# Patient Record
Sex: Female | Born: 1940 | Race: Black or African American | Hispanic: No | Marital: Married
Health system: Southern US, Community
[De-identification: ages and names within clinical notes are randomized; demographics above are authoritative.]

---

## 2005-09-24 ENCOUNTER — Emergency Department: Payer: Self-pay | Admitting: Unknown Physician Specialty

## 2010-12-10 ENCOUNTER — Emergency Department: Payer: Self-pay | Admitting: Emergency Medicine

## 2012-08-25 ENCOUNTER — Ambulatory Visit: Payer: Self-pay | Admitting: Oncology

## 2012-09-23 LAB — CBC
HCT: 44.3 % (ref 35.0–47.0)
HGB: 14.8 g/dL (ref 12.0–16.0)
MCHC: 33.4 g/dL (ref 32.0–36.0)
Platelet: 204 10*3/uL (ref 150–440)
RBC: 5.03 10*6/uL (ref 3.80–5.20)
WBC: 7.9 10*3/uL (ref 3.6–11.0)

## 2012-09-23 LAB — COMPREHENSIVE METABOLIC PANEL
Alkaline Phosphatase: 628 U/L — ABNORMAL HIGH (ref 50–136)
Anion Gap: 9 (ref 7–16)
Bilirubin,Total: 13.7 mg/dL — ABNORMAL HIGH (ref 0.2–1.0)
Chloride: 99 mmol/L (ref 98–107)
Co2: 27 mmol/L (ref 21–32)
Creatinine: 0.94 mg/dL (ref 0.60–1.30)
Osmolality: 275 (ref 275–301)
SGPT (ALT): 665 U/L — ABNORMAL HIGH (ref 12–78)
Sodium: 135 mmol/L — ABNORMAL LOW (ref 136–145)
Total Protein: 7.9 g/dL (ref 6.4–8.2)

## 2012-09-23 LAB — URINALYSIS, COMPLETE
Blood: NEGATIVE
Cellular Cast: 27
Glucose,UR: 150 mg/dL (ref 0–75)
Hyaline Cast: 25
Nitrite: NEGATIVE
Protein: 30
Specific Gravity: 1.015 (ref 1.003–1.030)
Squamous Epithelial: 3

## 2012-09-23 LAB — LIPASE, BLOOD: Lipase: 347 U/L (ref 73–393)

## 2012-09-24 ENCOUNTER — Inpatient Hospital Stay: Payer: Self-pay | Admitting: Internal Medicine

## 2012-09-25 ENCOUNTER — Ambulatory Visit: Payer: Self-pay | Admitting: Oncology

## 2012-09-25 LAB — HEPATIC FUNCTION PANEL A (ARMC)
Albumin: 3.2 g/dL — ABNORMAL LOW (ref 3.4–5.0)
Bilirubin, Direct: 10.8 mg/dL — ABNORMAL HIGH (ref 0.00–0.20)
SGOT(AST): 429 U/L — ABNORMAL HIGH (ref 15–37)
SGPT (ALT): 618 U/L — ABNORMAL HIGH (ref 12–78)
Total Protein: 6.8 g/dL (ref 6.4–8.2)

## 2012-09-25 LAB — PROTIME-INR: INR: 0.9

## 2012-09-26 ENCOUNTER — Ambulatory Visit: Payer: Self-pay | Admitting: Oncology

## 2012-09-26 LAB — HEPATIC FUNCTION PANEL A (ARMC)
Albumin: 2.9 g/dL — ABNORMAL LOW (ref 3.4–5.0)
Alkaline Phosphatase: 517 U/L — ABNORMAL HIGH (ref 50–136)
Bilirubin, Direct: 4.9 mg/dL — ABNORMAL HIGH (ref 0.00–0.20)
Bilirubin,Total: 6.2 mg/dL — ABNORMAL HIGH (ref 0.2–1.0)
Total Protein: 6.6 g/dL (ref 6.4–8.2)

## 2012-09-26 LAB — LIPASE, BLOOD: Lipase: 148 U/L (ref 73–393)

## 2012-10-10 LAB — COMPREHENSIVE METABOLIC PANEL
Albumin: 3.6 g/dL (ref 3.4–5.0)
Alkaline Phosphatase: 1486 U/L — ABNORMAL HIGH (ref 50–136)
Anion Gap: 7 (ref 7–16)
BUN: 6 mg/dL — ABNORMAL LOW (ref 7–18)
Co2: 29 mmol/L (ref 21–32)
EGFR (African American): >60
Osmolality: 269 (ref 275–301)
Potassium: 3.7 mmol/L (ref 3.5–5.1)
SGOT(AST): 168 U/L — ABNORMAL HIGH (ref 15–37)
Total Protein: 9 g/dL — ABNORMAL HIGH (ref 6.4–8.2)

## 2012-10-10 LAB — CBC CANCER CENTER
HCT: 42.6 % (ref 35.0–47.0)
HGB: 14.8 g/dL (ref 12.0–16.0)
Lymphocyte #: 2.9 x10 3/mm (ref 1.0–3.6)
Lymphocyte %: 29.6 %
MCH: 30.5 pg (ref 26.0–34.0)
MCV: 88 fL (ref 80–100)
Monocyte #: 0.6 x10 3/mm (ref 0.2–0.9)
Monocyte %: 6.3 %
Neutrophil #: 5.9 x10 3/mm (ref 1.4–6.5)
Neutrophil %: 60.1 %
RDW: 14.8 % — ABNORMAL HIGH (ref 11.5–14.5)

## 2012-10-11 LAB — CEA: CEA: 15.7 ng/mL — ABNORMAL HIGH (ref 0.0–4.7)

## 2012-10-22 LAB — COMPREHENSIVE METABOLIC PANEL
Albumin: 2.6 g/dL — ABNORMAL LOW (ref 3.4–5.0)
Alkaline Phosphatase: 775 U/L — ABNORMAL HIGH (ref 50–136)
Anion Gap: 9 (ref 7–16)
Bilirubin,Total: 1.8 mg/dL — ABNORMAL HIGH (ref 0.2–1.0)
Chloride: 95 mmol/L — ABNORMAL LOW (ref 98–107)
Co2: 29 mmol/L (ref 21–32)
EGFR (African American): >60
EGFR (Non-African Amer.): >60
Glucose: 276 mg/dL — ABNORMAL HIGH (ref 65–99)
Osmolality: 274 (ref 275–301)
Potassium: 3.4 mmol/L — ABNORMAL LOW (ref 3.5–5.1)
SGOT(AST): 50 U/L — ABNORMAL HIGH (ref 15–37)
SGPT (ALT): 59 U/L (ref 12–78)

## 2012-10-22 LAB — CBC CANCER CENTER
Basophil %: 0.8 %
Eosinophil #: 0.3 x10 3/mm (ref 0.0–0.7)
Eosinophil %: 2.5 %
HGB: 12.6 g/dL (ref 12.0–16.0)
Lymphocyte %: 21.7 %
MCHC: 34.1 g/dL (ref 32.0–36.0)
MCV: 87 fL (ref 80–100)
Monocyte %: 4.4 %
Neutrophil %: 70.6 %
WBC: 12.9 x10 3/mm — ABNORMAL HIGH (ref 3.6–11.0)

## 2012-10-23 LAB — CANCER ANTIGEN 19-9: CA 19-9: 5946 U/mL — ABNORMAL HIGH (ref 0–35)

## 2012-10-26 ENCOUNTER — Ambulatory Visit: Payer: Self-pay | Admitting: Oncology

## 2012-10-29 LAB — CBC CANCER CENTER
Basophil #: 0.1 x10 3/mm (ref 0.0–0.1)
HCT: 36 % (ref 35.0–47.0)
HGB: 12.6 g/dL (ref 12.0–16.0)
Lymphocyte #: 1.6 x10 3/mm (ref 1.0–3.6)
MCH: 30.2 pg (ref 26.0–34.0)
MCHC: 34.9 g/dL (ref 32.0–36.0)
MCV: 87 fL (ref 80–100)
Monocyte #: 0.3 x10 3/mm (ref 0.2–0.9)
Monocyte %: 6.8 %
Neutrophil #: 2.7 x10 3/mm (ref 1.4–6.5)
RBC: 4.17 10*6/uL (ref 3.80–5.20)
RDW: 14.4 % (ref 11.5–14.5)

## 2012-10-29 LAB — COMPREHENSIVE METABOLIC PANEL
Albumin: 2.8 g/dL — ABNORMAL LOW (ref 3.4–5.0)
Alkaline Phosphatase: 469 U/L — ABNORMAL HIGH (ref 50–136)
Anion Gap: 9 (ref 7–16)
BUN: 6 mg/dL — ABNORMAL LOW (ref 7–18)
Bilirubin,Total: 1.4 mg/dL — ABNORMAL HIGH (ref 0.2–1.0)
Chloride: 95 mmol/L — ABNORMAL LOW (ref 98–107)
Co2: 29 mmol/L (ref 21–32)
Creatinine: 0.79 mg/dL (ref 0.60–1.30)
EGFR (African American): >60
EGFR (Non-African Amer.): >60
Osmolality: 270 (ref 275–301)
Potassium: 4.2 mmol/L (ref 3.5–5.1)
Sodium: 133 mmol/L — ABNORMAL LOW (ref 136–145)
Total Protein: 8.4 g/dL — ABNORMAL HIGH (ref 6.4–8.2)

## 2012-11-05 LAB — COMPREHENSIVE METABOLIC PANEL
Albumin: 3.1 g/dL — ABNORMAL LOW (ref 3.4–5.0)
Alkaline Phosphatase: 470 U/L — ABNORMAL HIGH (ref 50–136)
Anion Gap: 9 (ref 7–16)
BUN: 6 mg/dL — ABNORMAL LOW (ref 7–18)
Calcium, Total: 9.1 mg/dL (ref 8.5–10.1)
Chloride: 97 mmol/L — ABNORMAL LOW (ref 98–107)
Co2: 29 mmol/L (ref 21–32)
Creatinine: 0.93 mg/dL (ref 0.60–1.30)
EGFR (African American): >60
EGFR (Non-African Amer.): >60
Glucose: 178 mg/dL — ABNORMAL HIGH (ref 65–99)
Osmolality: 272 (ref 275–301)
SGOT(AST): 27 U/L (ref 15–37)
SGPT (ALT): 31 U/L (ref 12–78)
Sodium: 135 mmol/L — ABNORMAL LOW (ref 136–145)

## 2012-11-05 LAB — CBC CANCER CENTER
Basophil #: 0 x10 3/mm (ref 0.0–0.1)
Basophil %: 0.3 %
Eosinophil #: 0.2 x10 3/mm (ref 0.0–0.7)
HCT: 35.2 % (ref 35.0–47.0)
Lymphocyte #: 1 x10 3/mm (ref 1.0–3.6)
Lymphocyte %: 23.3 %
MCH: 29.8 pg (ref 26.0–34.0)
MCV: 87 fL (ref 80–100)
Neutrophil #: 2.8 x10 3/mm (ref 1.4–6.5)
Neutrophil %: 67.8 %
WBC: 4.1 x10 3/mm (ref 3.6–11.0)

## 2012-11-06 LAB — CANCER ANTIGEN 19-9: CA 19-9: 8656 U/mL — ABNORMAL HIGH (ref 0–35)

## 2012-11-12 LAB — COMPREHENSIVE METABOLIC PANEL
Alkaline Phosphatase: 284 U/L — ABNORMAL HIGH (ref 50–136)
Anion Gap: 9 (ref 7–16)
Bilirubin,Total: 0.8 mg/dL (ref 0.2–1.0)
Calcium, Total: 9.2 mg/dL (ref 8.5–10.1)
Chloride: 97 mmol/L — ABNORMAL LOW (ref 98–107)
Co2: 28 mmol/L (ref 21–32)
Creatinine: 0.81 mg/dL (ref 0.60–1.30)
EGFR (African American): >60
EGFR (Non-African Amer.): >60
Glucose: 148 mg/dL — ABNORMAL HIGH (ref 65–99)
Osmolality: 269 (ref 275–301)
Potassium: 3.5 mmol/L (ref 3.5–5.1)
SGPT (ALT): 24 U/L (ref 12–78)
Sodium: 134 mmol/L — ABNORMAL LOW (ref 136–145)
Total Protein: 7.8 g/dL (ref 6.4–8.2)

## 2012-11-12 LAB — CBC CANCER CENTER
Basophil #: 0 x10 3/mm (ref 0.0–0.1)
Basophil %: 0.3 %
Eosinophil %: 2.2 %
MCH: 30.1 pg (ref 26.0–34.0)
MCV: 87 fL (ref 80–100)
Monocyte #: 0.4 x10 3/mm (ref 0.2–0.9)
Monocyte %: 7.8 %
Neutrophil #: 4 x10 3/mm (ref 1.4–6.5)
RBC: 4.01 10*6/uL (ref 3.80–5.20)
RDW: 14.4 % (ref 11.5–14.5)
WBC: 5.3 x10 3/mm (ref 3.6–11.0)

## 2012-11-13 LAB — CANCER ANTIGEN 19-9: CA 19-9: 12932 U/mL — ABNORMAL HIGH (ref 0–35)

## 2012-11-19 LAB — COMPREHENSIVE METABOLIC PANEL
Albumin: 2.9 g/dL — ABNORMAL LOW (ref 3.4–5.0)
BUN: 6 mg/dL — ABNORMAL LOW (ref 7–18)
Bilirubin,Total: 0.7 mg/dL (ref 0.2–1.0)
Calcium, Total: 8.9 mg/dL (ref 8.5–10.1)
Creatinine: 0.92 mg/dL (ref 0.60–1.30)
EGFR (African American): >60
Osmolality: 271 (ref 275–301)
Potassium: 2.8 mmol/L — ABNORMAL LOW (ref 3.5–5.1)
SGOT(AST): 27 U/L (ref 15–37)

## 2012-11-19 LAB — CBC CANCER CENTER
Basophil %: 0.5 %
Eosinophil #: 0.1 x10 3/mm (ref 0.0–0.7)
Eosinophil %: 1.8 %
HCT: 31.8 % — ABNORMAL LOW (ref 35.0–47.0)
Lymphocyte #: 0.8 x10 3/mm — ABNORMAL LOW (ref 1.0–3.6)
MCHC: 34.5 g/dL (ref 32.0–36.0)
MCV: 88 fL (ref 80–100)
Monocyte %: 8.2 %
Neutrophil #: 4.4 x10 3/mm (ref 1.4–6.5)
WBC: 5.8 x10 3/mm (ref 3.6–11.0)

## 2012-11-23 ENCOUNTER — Ambulatory Visit: Payer: Self-pay | Admitting: Oncology

## 2012-11-26 LAB — COMPREHENSIVE METABOLIC PANEL
Albumin: 3 g/dL — ABNORMAL LOW (ref 3.4–5.0)
Alkaline Phosphatase: 262 U/L — ABNORMAL HIGH (ref 50–136)
BUN: 5 mg/dL — ABNORMAL LOW (ref 7–18)
Calcium, Total: 8.8 mg/dL (ref 8.5–10.1)
Chloride: 97 mmol/L — ABNORMAL LOW (ref 98–107)
Co2: 28 mmol/L (ref 21–32)
Osmolality: 271 (ref 275–301)
Potassium: 3.6 mmol/L (ref 3.5–5.1)
SGPT (ALT): 30 U/L (ref 12–78)
Sodium: 136 mmol/L (ref 136–145)

## 2012-11-26 LAB — CBC CANCER CENTER
Basophil #: 0 x10 3/mm (ref 0.0–0.1)
Eosinophil %: 2.2 %
HGB: 11.9 g/dL — ABNORMAL LOW (ref 12.0–16.0)
Lymphocyte #: 0.6 x10 3/mm — ABNORMAL LOW (ref 1.0–3.6)
Lymphocyte %: 13.6 %
MCH: 30.3 pg (ref 26.0–34.0)
MCHC: 34.5 g/dL (ref 32.0–36.0)
MCV: 88 fL (ref 80–100)
Monocyte %: 7.9 %
Neutrophil %: 76 %
RBC: 3.94 10*6/uL (ref 3.80–5.20)
RDW: 15.5 % — ABNORMAL HIGH (ref 11.5–14.5)
WBC: 4.2 x10 3/mm (ref 3.6–11.0)

## 2012-12-03 LAB — CBC CANCER CENTER
Basophil %: 0.8 %
Eosinophil #: 0.2 x10 3/mm (ref 0.0–0.7)
Eosinophil %: 1.7 %
HCT: 32.9 % — ABNORMAL LOW (ref 35.0–47.0)
HGB: 11 g/dL — ABNORMAL LOW (ref 12.0–16.0)
Lymphocyte %: 21.4 %
MCV: 89 fL (ref 80–100)
Monocyte #: 1 x10 3/mm — ABNORMAL HIGH (ref 0.2–0.9)
Neutrophil #: 7.5 x10 3/mm — ABNORMAL HIGH (ref 1.4–6.5)
RDW: 16.5 % — ABNORMAL HIGH (ref 11.5–14.5)
WBC: 11.2 x10 3/mm — ABNORMAL HIGH (ref 3.6–11.0)

## 2012-12-03 LAB — COMPREHENSIVE METABOLIC PANEL
Albumin: 2.8 g/dL — ABNORMAL LOW (ref 3.4–5.0)
Alkaline Phosphatase: 241 U/L — ABNORMAL HIGH (ref 50–136)
BUN: 9 mg/dL (ref 7–18)
Bilirubin,Total: 0.4 mg/dL (ref 0.2–1.0)
Chloride: 97 mmol/L — ABNORMAL LOW (ref 98–107)
Co2: 28 mmol/L (ref 21–32)
EGFR (Non-African Amer.): >60
Osmolality: 277 (ref 275–301)
Potassium: 4 mmol/L (ref 3.5–5.1)
SGOT(AST): 37 U/L (ref 15–37)
Sodium: 134 mmol/L — ABNORMAL LOW (ref 136–145)
Total Protein: 7.4 g/dL (ref 6.4–8.2)

## 2012-12-04 ENCOUNTER — Ambulatory Visit: Payer: Self-pay | Admitting: Vascular Surgery

## 2012-12-24 ENCOUNTER — Ambulatory Visit: Payer: Self-pay | Admitting: Oncology

## 2012-12-31 LAB — CBC CANCER CENTER
Basophil #: 0.1 x10 3/mm (ref 0.0–0.1)
Basophil %: 0.4 %
Eosinophil #: 0.3 x10 3/mm (ref 0.0–0.7)
HGB: 11.1 g/dL — ABNORMAL LOW (ref 12.0–16.0)
Lymphocyte #: 1.8 x10 3/mm (ref 1.0–3.6)
MCH: 29.8 pg (ref 26.0–34.0)
MCV: 90 fL (ref 80–100)
Monocyte #: 1.2 x10 3/mm — ABNORMAL HIGH (ref 0.2–0.9)
Monocyte %: 8.5 %
Neutrophil #: 11.2 x10 3/mm — ABNORMAL HIGH (ref 1.4–6.5)
Platelet: 112 x10 3/mm — ABNORMAL LOW (ref 150–440)
RDW: 16.6 % — ABNORMAL HIGH (ref 11.5–14.5)

## 2012-12-31 LAB — COMPREHENSIVE METABOLIC PANEL
Albumin: 3 g/dL — ABNORMAL LOW (ref 3.4–5.0)
Anion Gap: 9 (ref 7–16)
BUN: 8 mg/dL (ref 7–18)
Calcium, Total: 8.9 mg/dL (ref 8.5–10.1)
Chloride: 97 mmol/L — ABNORMAL LOW (ref 98–107)
Co2: 28 mmol/L (ref 21–32)
Creatinine: 0.75 mg/dL (ref 0.60–1.30)
EGFR (African American): >60
Glucose: 136 mg/dL — ABNORMAL HIGH (ref 65–99)
Potassium: 3.7 mmol/L (ref 3.5–5.1)
SGOT(AST): 62 U/L — ABNORMAL HIGH (ref 15–37)
SGPT (ALT): 45 U/L (ref 12–78)
Sodium: 134 mmol/L — ABNORMAL LOW (ref 136–145)
Total Protein: 8.1 g/dL (ref 6.4–8.2)

## 2013-01-02 LAB — CANCER ANTIGEN 19-9

## 2013-01-07 LAB — CBC CANCER CENTER
Basophil #: 0.1 x10 3/mm (ref 0.0–0.1)
Eosinophil #: 0.1 x10 3/mm (ref 0.0–0.7)
Lymphocyte %: 11.2 %
MCHC: 33.4 g/dL (ref 32.0–36.0)
MCV: 90 fL (ref 80–100)
Monocyte #: 1.1 x10 3/mm — ABNORMAL HIGH (ref 0.2–0.9)
Monocyte %: 8.1 %
Neutrophil #: 10.8 x10 3/mm — ABNORMAL HIGH (ref 1.4–6.5)
Platelet: 31 x10 3/mm — ABNORMAL LOW (ref 150–440)
RBC: 3.03 10*6/uL — ABNORMAL LOW (ref 3.80–5.20)
RDW: 16 % — ABNORMAL HIGH (ref 11.5–14.5)
WBC: 13.6 x10 3/mm — ABNORMAL HIGH (ref 3.6–11.0)

## 2013-01-07 LAB — COMPREHENSIVE METABOLIC PANEL
Albumin: 2.7 g/dL — ABNORMAL LOW (ref 3.4–5.0)
Alkaline Phosphatase: 319 U/L — ABNORMAL HIGH (ref 50–136)
BUN: 12 mg/dL (ref 7–18)
Bilirubin,Total: 0.6 mg/dL (ref 0.2–1.0)
Calcium, Total: 9 mg/dL (ref 8.5–10.1)
Chloride: 97 mmol/L — ABNORMAL LOW (ref 98–107)
Co2: 27 mmol/L (ref 21–32)
Creatinine: 0.87 mg/dL (ref 0.60–1.30)
EGFR (Non-African Amer.): >60
Potassium: 3.5 mmol/L (ref 3.5–5.1)
SGPT (ALT): 26 U/L (ref 12–78)
Sodium: 134 mmol/L — ABNORMAL LOW (ref 136–145)
Total Protein: 7.8 g/dL (ref 6.4–8.2)

## 2013-01-11 ENCOUNTER — Inpatient Hospital Stay: Payer: Self-pay | Admitting: Internal Medicine

## 2013-01-11 LAB — COMPREHENSIVE METABOLIC PANEL
Albumin: 2.2 g/dL — ABNORMAL LOW (ref 3.4–5.0)
Anion Gap: 8 (ref 7–16)
BUN: 9 mg/dL (ref 7–18)
Bilirubin,Total: 1.1 mg/dL — ABNORMAL HIGH (ref 0.2–1.0)
Chloride: 100 mmol/L (ref 98–107)
Co2: 25 mmol/L (ref 21–32)
EGFR (African American): >60
EGFR (Non-African Amer.): >60
Glucose: 197 mg/dL — ABNORMAL HIGH (ref 65–99)
Potassium: 3.5 mmol/L (ref 3.5–5.1)
SGPT (ALT): 22 U/L (ref 12–78)
Sodium: 133 mmol/L — ABNORMAL LOW (ref 136–145)
Total Protein: 6.8 g/dL (ref 6.4–8.2)

## 2013-01-11 LAB — PROTIME-INR: INR: 1.3

## 2013-01-11 LAB — TROPONIN I: Troponin-I: 0.07 ng/mL — ABNORMAL HIGH

## 2013-01-11 LAB — HEMOGLOBIN: HGB: 10 g/dL — ABNORMAL LOW (ref 12.0–16.0)

## 2013-01-11 LAB — CBC
HCT: 23.5 % — ABNORMAL LOW (ref 35.0–47.0)
MCHC: 33.1 g/dL (ref 32.0–36.0)
Platelet: 102 10*3/uL — ABNORMAL LOW (ref 150–440)
RDW: 16.1 % — ABNORMAL HIGH (ref 11.5–14.5)

## 2013-01-12 LAB — CBC WITH DIFFERENTIAL/PLATELET
Basophil #: 0 10*3/uL (ref 0.0–0.1)
Basophil %: 0.3 %
Eosinophil %: 0.7 %
HGB: 9.9 g/dL — ABNORMAL LOW (ref 12.0–16.0)
Lymphocyte #: 1.1 10*3/uL (ref 1.0–3.6)
Lymphocyte %: 11.4 %
MCH: 30.8 pg (ref 26.0–34.0)
MCHC: 34.4 g/dL (ref 32.0–36.0)
Monocyte #: 0.8 x10 3/mm (ref 0.2–0.9)
Monocyte %: 8.7 %
Neutrophil #: 7.5 10*3/uL — ABNORMAL HIGH (ref 1.4–6.5)
Neutrophil %: 78.9 %
RBC: 3.21 10*6/uL — ABNORMAL LOW (ref 3.80–5.20)
WBC: 9.6 10*3/uL (ref 3.6–11.0)

## 2013-01-12 LAB — COMPREHENSIVE METABOLIC PANEL
Albumin: 2 g/dL — ABNORMAL LOW (ref 3.4–5.0)
Alkaline Phosphatase: 469 U/L — ABNORMAL HIGH (ref 50–136)
Anion Gap: 7 (ref 7–16)
BUN: 10 mg/dL (ref 7–18)
Calcium, Total: 7.8 mg/dL — ABNORMAL LOW (ref 8.5–10.1)
Co2: 23 mmol/L (ref 21–32)
Creatinine: 0.55 mg/dL — ABNORMAL LOW (ref 0.60–1.30)
Glucose: 83 mg/dL (ref 65–99)
Osmolality: 276 (ref 275–301)
Potassium: 4 mmol/L (ref 3.5–5.1)
SGOT(AST): 41 U/L — ABNORMAL HIGH (ref 15–37)
SGPT (ALT): 19 U/L (ref 12–78)
Total Protein: 6.2 g/dL — ABNORMAL LOW (ref 6.4–8.2)

## 2013-01-12 LAB — HEMOGLOBIN: HGB: 9.7 g/dL — ABNORMAL LOW (ref 12.0–16.0)

## 2013-01-13 LAB — BASIC METABOLIC PANEL
Anion Gap: 6 — ABNORMAL LOW (ref 7–16)
BUN: 5 mg/dL — ABNORMAL LOW (ref 7–18)
Chloride: 106 mmol/L (ref 98–107)
Co2: 25 mmol/L (ref 21–32)
Creatinine: 0.47 mg/dL — ABNORMAL LOW (ref 0.60–1.30)
Potassium: 3 mmol/L — ABNORMAL LOW (ref 3.5–5.1)

## 2013-01-13 LAB — CBC WITH DIFFERENTIAL/PLATELET
Basophil %: 0.9 %
Eosinophil #: 0.1 10*3/uL (ref 0.0–0.7)
Eosinophil %: 0.8 %
HCT: 28 % — ABNORMAL LOW (ref 35.0–47.0)
HGB: 9.7 g/dL — ABNORMAL LOW (ref 12.0–16.0)
Lymphocyte #: 1.1 10*3/uL (ref 1.0–3.6)
Lymphocyte %: 11.8 %
MCHC: 34.5 g/dL (ref 32.0–36.0)
Monocyte #: 0.9 x10 3/mm (ref 0.2–0.9)
Monocyte %: 9.1 %
Neutrophil #: 7.4 10*3/uL — ABNORMAL HIGH (ref 1.4–6.5)
Neutrophil %: 77.4 %
RBC: 3.12 10*6/uL — ABNORMAL LOW (ref 3.80–5.20)
WBC: 9.5 10*3/uL (ref 3.6–11.0)

## 2013-01-13 LAB — POTASSIUM: Potassium: 3.4 mmol/L — ABNORMAL LOW (ref 3.5–5.1)

## 2013-01-16 LAB — COMPREHENSIVE METABOLIC PANEL
Albumin: 2.3 g/dL — ABNORMAL LOW (ref 3.4–5.0)
Anion Gap: 9 (ref 7–16)
BUN: 10 mg/dL (ref 7–18)
Bilirubin,Total: 0.6 mg/dL (ref 0.2–1.0)
Calcium, Total: 8.7 mg/dL (ref 8.5–10.1)
EGFR (Non-African Amer.): >60
Osmolality: 264 (ref 275–301)
Potassium: 3.7 mmol/L (ref 3.5–5.1)
SGOT(AST): 30 U/L (ref 15–37)
SGPT (ALT): 17 U/L (ref 12–78)

## 2013-01-16 LAB — CBC CANCER CENTER
Eosinophil #: 0 x10 3/mm (ref 0.0–0.7)
Lymphocyte #: 1.5 x10 3/mm (ref 1.0–3.6)
MCH: 29.6 pg (ref 26.0–34.0)
Monocyte #: 1.4 x10 3/mm — ABNORMAL HIGH (ref 0.2–0.9)
Neutrophil #: 9.6 x10 3/mm — ABNORMAL HIGH (ref 1.4–6.5)
RDW: 15.7 % — ABNORMAL HIGH (ref 11.5–14.5)
WBC: 12.6 x10 3/mm — ABNORMAL HIGH (ref 3.6–11.0)

## 2013-01-23 ENCOUNTER — Ambulatory Visit: Payer: Self-pay | Admitting: Oncology

## 2013-01-23 LAB — CBC CANCER CENTER
Basophil #: 0 x10 3/mm (ref 0.0–0.1)
Basophil %: 0.2 %
Eosinophil %: 0.6 %
MCV: 90 fL (ref 80–100)
Monocyte %: 8.5 %
Neutrophil #: 7.7 x10 3/mm — ABNORMAL HIGH (ref 1.4–6.5)
Neutrophil %: 78.1 %
Platelet: 91 x10 3/mm — ABNORMAL LOW (ref 150–440)
RBC: 3.5 10*6/uL — ABNORMAL LOW (ref 3.80–5.20)

## 2013-01-23 LAB — PROTIME-INR: INR: 1.8

## 2013-01-28 ENCOUNTER — Ambulatory Visit: Payer: Self-pay | Admitting: Gastroenterology

## 2013-01-30 LAB — COMPREHENSIVE METABOLIC PANEL
Albumin: 2.3 g/dL — ABNORMAL LOW (ref 3.4–5.0)
Alkaline Phosphatase: 419 U/L — ABNORMAL HIGH (ref 50–136)
BUN: 6 mg/dL — ABNORMAL LOW (ref 7–18)
Bilirubin,Total: 0.5 mg/dL (ref 0.2–1.0)
Calcium, Total: 8.8 mg/dL (ref 8.5–10.1)
Chloride: 99 mmol/L (ref 98–107)
Creatinine: 0.68 mg/dL (ref 0.60–1.30)
EGFR (African American): >60
Glucose: 108 mg/dL — ABNORMAL HIGH (ref 65–99)
Potassium: 3 mmol/L — ABNORMAL LOW (ref 3.5–5.1)
SGOT(AST): 36 U/L (ref 15–37)
Total Protein: 7.6 g/dL (ref 6.4–8.2)

## 2013-01-30 LAB — CBC CANCER CENTER
Basophil #: 0.1 x10 3/mm (ref 0.0–0.1)
Eosinophil #: 0.1 x10 3/mm (ref 0.0–0.7)
Eosinophil %: 0.9 %
HCT: 29.4 % — ABNORMAL LOW (ref 35.0–47.0)
Lymphocyte #: 1.5 x10 3/mm (ref 1.0–3.6)
Lymphocyte %: 14.1 %
MCH: 30.1 pg (ref 26.0–34.0)
MCHC: 33.7 g/dL (ref 32.0–36.0)
MCV: 89 fL (ref 80–100)
Neutrophil #: 8.2 x10 3/mm — ABNORMAL HIGH (ref 1.4–6.5)
Neutrophil %: 76.1 %
Platelet: 348 x10 3/mm (ref 150–440)
RBC: 3.29 10*6/uL — ABNORMAL LOW (ref 3.80–5.20)
RDW: 15.8 % — ABNORMAL HIGH (ref 11.5–14.5)
WBC: 10.7 x10 3/mm (ref 3.6–11.0)

## 2013-01-30 LAB — PROTIME-INR: Prothrombin Time: 16.9 secs — ABNORMAL HIGH (ref 11.5–14.7)

## 2013-02-13 LAB — CBC CANCER CENTER
Basophil #: 0.1 x10 3/mm (ref 0.0–0.1)
Basophil %: 1 %
Eosinophil #: 0.2 x10 3/mm (ref 0.0–0.7)
Eosinophil %: 1.8 %
HCT: 27.8 % — ABNORMAL LOW (ref 35.0–47.0)
HGB: 9.1 g/dL — ABNORMAL LOW (ref 12.0–16.0)
Lymphocyte #: 1.2 x10 3/mm (ref 1.0–3.6)
Lymphocyte %: 10.1 %
MCH: 29.4 pg (ref 26.0–34.0)
MCHC: 32.8 g/dL (ref 32.0–36.0)
MCV: 90 fL (ref 80–100)
Monocyte #: 1.1 x10 3/mm — ABNORMAL HIGH (ref 0.2–0.9)
Monocyte %: 9.9 %
Neutrophil %: 77.2 %
Platelet: 365 x10 3/mm (ref 150–440)
RBC: 3.11 10*6/uL — ABNORMAL LOW (ref 3.80–5.20)
RDW: 16.9 % — ABNORMAL HIGH (ref 11.5–14.5)
WBC: 11.6 x10 3/mm — ABNORMAL HIGH (ref 3.6–11.0)

## 2013-02-13 LAB — COMPREHENSIVE METABOLIC PANEL
Anion Gap: 12 (ref 7–16)
EGFR (African American): >60
EGFR (Non-African Amer.): >60
Glucose: 121 mg/dL — ABNORMAL HIGH (ref 65–99)
SGOT(AST): 43 U/L — ABNORMAL HIGH (ref 15–37)
SGPT (ALT): 40 U/L (ref 12–78)
Sodium: 137 mmol/L (ref 136–145)
Total Protein: 7.7 g/dL (ref 6.4–8.2)

## 2013-02-13 LAB — PROTIME-INR
INR: 2.4
Prothrombin Time: 25.9 secs — ABNORMAL HIGH (ref 11.5–14.7)

## 2013-02-20 LAB — PROTIME-INR: Prothrombin Time: 24.7 secs — ABNORMAL HIGH (ref 11.5–14.7)

## 2013-02-20 LAB — CBC CANCER CENTER
Basophil #: 0.1 x10 3/mm (ref 0.0–0.1)
Eosinophil %: 1.9 %
Lymphocyte %: 13.8 %
MCHC: 32.8 g/dL (ref 32.0–36.0)
Monocyte #: 0.8 x10 3/mm (ref 0.2–0.9)
Monocyte %: 7.1 %
Neutrophil %: 76.7 %
Platelet: 58 x10 3/mm — ABNORMAL LOW (ref 150–440)
RBC: 2.8 10*6/uL — ABNORMAL LOW (ref 3.80–5.20)
RDW: 17.1 % — ABNORMAL HIGH (ref 11.5–14.5)
WBC: 11.3 x10 3/mm — ABNORMAL HIGH (ref 3.6–11.0)

## 2013-02-21 LAB — CANCER ANTIGEN 19-9

## 2013-02-23 ENCOUNTER — Ambulatory Visit: Payer: Self-pay | Admitting: Oncology

## 2013-02-27 LAB — COMPREHENSIVE METABOLIC PANEL
Albumin: 2.4 g/dL — ABNORMAL LOW (ref 3.4–5.0)
Bilirubin,Total: 0.7 mg/dL (ref 0.2–1.0)
Calcium, Total: 8.7 mg/dL (ref 8.5–10.1)
Co2: 24 mmol/L (ref 21–32)
Creatinine: 0.71 mg/dL (ref 0.60–1.30)
EGFR (African American): >60
EGFR (Non-African Amer.): >60
Glucose: 117 mg/dL — ABNORMAL HIGH (ref 65–99)
Potassium: 3.3 mmol/L — ABNORMAL LOW (ref 3.5–5.1)
SGPT (ALT): 26 U/L (ref 12–78)
Total Protein: 7.5 g/dL (ref 6.4–8.2)

## 2013-02-27 LAB — CBC CANCER CENTER
Basophil #: 0 x10 3/mm (ref 0.0–0.1)
Basophil %: 0.3 %
Eosinophil #: 0.1 x10 3/mm (ref 0.0–0.7)
HCT: 27 % — ABNORMAL LOW (ref 35.0–47.0)
HGB: 9.1 g/dL — ABNORMAL LOW (ref 12.0–16.0)
Lymphocyte #: 1.1 x10 3/mm (ref 1.0–3.6)
MCH: 30 pg (ref 26.0–34.0)
MCV: 90 fL (ref 80–100)
Neutrophil #: 12.7 x10 3/mm — ABNORMAL HIGH (ref 1.4–6.5)
Neutrophil %: 85 %
Platelet: 247 x10 3/mm (ref 150–440)

## 2013-02-27 LAB — PROTIME-INR: Prothrombin Time: 36 secs — ABNORMAL HIGH (ref 11.5–14.7)

## 2013-03-13 LAB — CBC CANCER CENTER
Basophil #: 0.2 x10 3/mm — ABNORMAL HIGH (ref 0.0–0.1)
Basophil %: 1.5 %
Eosinophil #: 0.1 x10 3/mm (ref 0.0–0.7)
Eosinophil %: 0.9 %
HCT: 25.2 % — ABNORMAL LOW (ref 35.0–47.0)
HGB: 8.5 g/dL — ABNORMAL LOW (ref 12.0–16.0)
MCH: 29.9 pg (ref 26.0–34.0)
MCHC: 33.7 g/dL (ref 32.0–36.0)
Monocyte %: 6.7 %
Neutrophil #: 10.2 x10 3/mm — ABNORMAL HIGH (ref 1.4–6.5)
Neutrophil %: 82.2 %
WBC: 12.4 x10 3/mm — ABNORMAL HIGH (ref 3.6–11.0)

## 2013-03-13 LAB — COMPREHENSIVE METABOLIC PANEL
Alkaline Phosphatase: 685 U/L — ABNORMAL HIGH (ref 50–136)
Calcium, Total: 8.5 mg/dL (ref 8.5–10.1)
Creatinine: 0.7 mg/dL (ref 0.60–1.30)
EGFR (African American): >60
EGFR (Non-African Amer.): >60
Osmolality: 268 (ref 275–301)
SGOT(AST): 57 U/L — ABNORMAL HIGH (ref 15–37)
SGPT (ALT): 26 U/L (ref 12–78)
Sodium: 134 mmol/L — ABNORMAL LOW (ref 136–145)
Total Protein: 7.3 g/dL (ref 6.4–8.2)

## 2013-03-13 LAB — PROTIME-INR
INR: 5
Prothrombin Time: 44.1 secs — ABNORMAL HIGH (ref 11.5–14.7)

## 2013-03-20 ENCOUNTER — Inpatient Hospital Stay: Payer: Self-pay | Admitting: Oncology

## 2013-03-20 LAB — BASIC METABOLIC PANEL
Anion Gap: 8 (ref 7–16)
BUN: 16 mg/dL (ref 7–18)
Calcium, Total: 8.6 mg/dL (ref 8.5–10.1)
Chloride: 97 mmol/L — ABNORMAL LOW (ref 98–107)
Co2: 28 mmol/L (ref 21–32)
Creatinine: 0.75 mg/dL (ref 0.60–1.30)
EGFR (African American): >60
EGFR (Non-African Amer.): >60
Glucose: 139 mg/dL — ABNORMAL HIGH (ref 65–99)
Osmolality: 270 (ref 275–301)
Sodium: 133 mmol/L — ABNORMAL LOW (ref 136–145)

## 2013-03-20 LAB — CBC CANCER CENTER
Eosinophil #: 0.1 x10 3/mm (ref 0.0–0.7)
Eosinophil %: 0.5 %
Lymphocyte #: 1 x10 3/mm (ref 1.0–3.6)
Lymphocyte %: 8.8 %
MCH: 30 pg (ref 26.0–34.0)
Neutrophil #: 9.3 x10 3/mm — ABNORMAL HIGH (ref 1.4–6.5)
Platelet: 38 x10 3/mm — ABNORMAL LOW (ref 150–440)
RBC: 2.41 10*6/uL — ABNORMAL LOW (ref 3.80–5.20)
RDW: 18.8 % — ABNORMAL HIGH (ref 11.5–14.5)
WBC: 11.2 x10 3/mm — ABNORMAL HIGH (ref 3.6–11.0)

## 2013-03-20 LAB — PROTIME-INR: Prothrombin Time: 20.9 secs — ABNORMAL HIGH (ref 11.5–14.7)

## 2013-03-21 LAB — COMPREHENSIVE METABOLIC PANEL
Albumin: 1.3 g/dL — ABNORMAL LOW (ref 3.4–5.0)
Albumin: 2 g/dL — ABNORMAL LOW (ref 3.4–5.0)
Alkaline Phosphatase: 410 U/L — ABNORMAL HIGH (ref 50–136)
Alkaline Phosphatase: 610 U/L — ABNORMAL HIGH (ref 50–136)
Anion Gap: 8 (ref 7–16)
BUN: 14 mg/dL (ref 7–18)
Bilirubin,Total: 0.5 mg/dL (ref 0.2–1.0)
Calcium, Total: 7.9 mg/dL — ABNORMAL LOW (ref 8.5–10.1)
Chloride: 97 mmol/L — ABNORMAL LOW (ref 98–107)
Co2: 21 mmol/L (ref 21–32)
Co2: 30 mmol/L (ref 21–32)
Creatinine: 0.37 mg/dL — ABNORMAL LOW (ref 0.60–1.30)
Creatinine: 0.68 mg/dL (ref 0.60–1.30)
EGFR (African American): >60
EGFR (African American): >60
EGFR (Non-African Amer.): >60
EGFR (Non-African Amer.): >60
Glucose: 99 mg/dL (ref 65–99)
Osmolality: 265 (ref 275–301)
Osmolality: 279 (ref 275–301)
Potassium: 2.4 mmol/L — CL (ref 3.5–5.1)
Potassium: 3.4 mmol/L — ABNORMAL LOW (ref 3.5–5.1)
SGOT(AST): 35 U/L (ref 15–37)
SGOT(AST): 51 U/L — ABNORMAL HIGH (ref 15–37)
Sodium: 132 mmol/L — ABNORMAL LOW (ref 136–145)
Total Protein: 3.8 g/dL — ABNORMAL LOW (ref 6.4–8.2)
Total Protein: 5.7 g/dL — ABNORMAL LOW (ref 6.4–8.2)

## 2013-03-21 LAB — CBC WITH DIFFERENTIAL/PLATELET
Basophil %: 0.4 %
Eosinophil #: 0.1 10*3/uL (ref 0.0–0.7)
HCT: 16.1 % — ABNORMAL LOW (ref 35.0–47.0)
HGB: 5.2 g/dL — ABNORMAL LOW (ref 12.0–16.0)
Lymphocyte #: 0.7 10*3/uL — ABNORMAL LOW (ref 1.0–3.6)
MCH: 29.8 pg (ref 26.0–34.0)
MCHC: 33.3 g/dL (ref 32.0–36.0)
Monocyte #: 1.2 x10 3/mm — ABNORMAL HIGH (ref 0.2–0.9)
Monocyte %: 12.6 %
Neutrophil #: 7.4 10*3/uL — ABNORMAL HIGH (ref 1.4–6.5)
Neutrophil #: 7.6 10*3/uL — ABNORMAL HIGH (ref 1.4–6.5)
Neutrophil %: 78.5 %
Platelet: 29 10*3/uL — CL (ref 150–440)
RBC: 1.74 10*6/uL — ABNORMAL LOW (ref 3.80–5.20)
RDW: 18.6 % — ABNORMAL HIGH (ref 11.5–14.5)
RDW: 18.8 % — ABNORMAL HIGH (ref 11.5–14.5)
WBC: 9.7 10*3/uL (ref 3.6–11.0)
WBC: 9.7 10*3/uL (ref 3.6–11.0)

## 2013-03-21 LAB — PLATELET COUNT: Platelet: 32 10*3/uL — ABNORMAL LOW (ref 150–440)

## 2013-03-21 LAB — PROTIME-INR: Prothrombin Time: 22.1 secs — ABNORMAL HIGH (ref 11.5–14.7)

## 2013-03-21 LAB — POTASSIUM: Potassium: 3.6 mmol/L (ref 3.5–5.1)

## 2013-03-22 LAB — CBC WITH DIFFERENTIAL/PLATELET
HCT: 21.4 % — ABNORMAL LOW (ref 35.0–47.0)
HGB: 7.2 g/dL — ABNORMAL LOW (ref 12.0–16.0)
Lymphocytes: 4 %
MCH: 29.9 pg (ref 26.0–34.0)
MCV: 89 fL (ref 80–100)
NRBC/100 WBC: 4 /
Platelet: 102 10*3/uL — ABNORMAL LOW (ref 150–440)
RBC: 2.41 10*6/uL — ABNORMAL LOW (ref 3.80–5.20)
RDW: 18.2 % — ABNORMAL HIGH (ref 11.5–14.5)
Segmented Neutrophils: 88 %
Variant Lymphocyte - H1-Rlymph: 1 %
WBC: 12 10*3/uL — ABNORMAL HIGH (ref 3.6–11.0)

## 2013-03-22 LAB — BASIC METABOLIC PANEL
Calcium, Total: 8.1 mg/dL — ABNORMAL LOW (ref 8.5–10.1)
Co2: 26 mmol/L (ref 21–32)
Creatinine: 0.63 mg/dL (ref 0.60–1.30)
EGFR (Non-African Amer.): >60
Osmolality: 271 (ref 275–301)
Sodium: 136 mmol/L (ref 136–145)

## 2013-03-22 LAB — PROTIME-INR
INR: 2.3
Prothrombin Time: 24.4 secs — ABNORMAL HIGH (ref 11.5–14.7)

## 2013-03-24 LAB — COMPREHENSIVE METABOLIC PANEL
Albumin: 2.2 g/dL — ABNORMAL LOW (ref 3.4–5.0)
Alkaline Phosphatase: 616 U/L — ABNORMAL HIGH (ref 50–136)
BUN: 5 mg/dL — ABNORMAL LOW (ref 7–18)
Calcium, Total: 8.5 mg/dL (ref 8.5–10.1)
Chloride: 100 mmol/L (ref 98–107)
EGFR (Non-African Amer.): >60
Osmolality: 268 (ref 275–301)
Potassium: 3.7 mmol/L (ref 3.5–5.1)
SGPT (ALT): 30 U/L (ref 12–78)
Sodium: 134 mmol/L — ABNORMAL LOW (ref 136–145)
Total Protein: 6.6 g/dL (ref 6.4–8.2)

## 2013-03-24 LAB — CBC CANCER CENTER
Basophil %: 0.4 %
Eosinophil #: 0.2 x10 3/mm (ref 0.0–0.7)
HCT: 22.9 % — ABNORMAL LOW (ref 35.0–47.0)
Lymphocyte #: 1.2 x10 3/mm (ref 1.0–3.6)
Lymphocyte %: 7.6 %
Monocyte #: 1.4 x10 3/mm — ABNORMAL HIGH (ref 0.2–0.9)
Monocyte %: 9 %
Neutrophil %: 82 %
Platelet: 73 x10 3/mm — ABNORMAL LOW (ref 150–440)
WBC: 15.6 x10 3/mm — ABNORMAL HIGH (ref 3.6–11.0)

## 2013-03-25 ENCOUNTER — Ambulatory Visit: Payer: Self-pay | Admitting: Oncology

## 2013-03-26 ENCOUNTER — Emergency Department: Payer: Self-pay | Admitting: Emergency Medicine

## 2013-03-26 LAB — CBC WITH DIFFERENTIAL/PLATELET
Basophil #: 0 10*3/uL (ref 0.0–0.1)
Basophil %: 0.4 %
Eosinophil #: 0 10*3/uL (ref 0.0–0.7)
Eosinophil %: 0.1 %
HCT: 23.9 % — ABNORMAL LOW (ref 35.0–47.0)
HGB: 7.8 g/dL — ABNORMAL LOW (ref 12.0–16.0)
Lymphocyte %: 5.2 %
MCH: 31.2 pg (ref 26.0–34.0)
MCHC: 32.8 g/dL (ref 32.0–36.0)
Monocyte #: 0.5 x10 3/mm (ref 0.2–0.9)
Monocyte %: 6.4 %
Neutrophil #: 6.8 10*3/uL — ABNORMAL HIGH (ref 1.4–6.5)

## 2013-03-26 LAB — COMPREHENSIVE METABOLIC PANEL
Albumin: 2 g/dL — ABNORMAL LOW (ref 3.4–5.0)
Alkaline Phosphatase: 538 U/L — ABNORMAL HIGH (ref 50–136)
Anion Gap: 10 (ref 7–16)
BUN: 11 mg/dL (ref 7–18)
Bilirubin,Total: 1.9 mg/dL — ABNORMAL HIGH (ref 0.2–1.0)
Calcium, Total: 8.2 mg/dL — ABNORMAL LOW (ref 8.5–10.1)
Co2: 23 mmol/L (ref 21–32)
Creatinine: 1.12 mg/dL (ref 0.60–1.30)
EGFR (Non-African Amer.): 50 — ABNORMAL LOW
Glucose: 113 mg/dL — ABNORMAL HIGH (ref 65–99)
Potassium: 3.8 mmol/L (ref 3.5–5.1)
Sodium: 136 mmol/L (ref 136–145)
Total Protein: 5.8 g/dL — ABNORMAL LOW (ref 6.4–8.2)

## 2013-03-26 LAB — URINALYSIS, COMPLETE
Blood: NEGATIVE
Glucose,UR: NEGATIVE mg/dL (ref 0–75)
Hyaline Cast: 1
RBC,UR: <1 /HPF (ref 0–5)
Squamous Epithelial: NONE SEEN

## 2013-04-25 DEATH — deceased

## 2013-08-24 IMAGING — CT CT SIM MISC
1 series · 16 of 32 positions shown, 20 images · non-contrast
Comparison: none

[Series 2: — · axial · 1.17mm/px · z∈[-235,+44]mm · 16 of 104 slices shown, 20 images]
[im 7/104  soft-tissue]
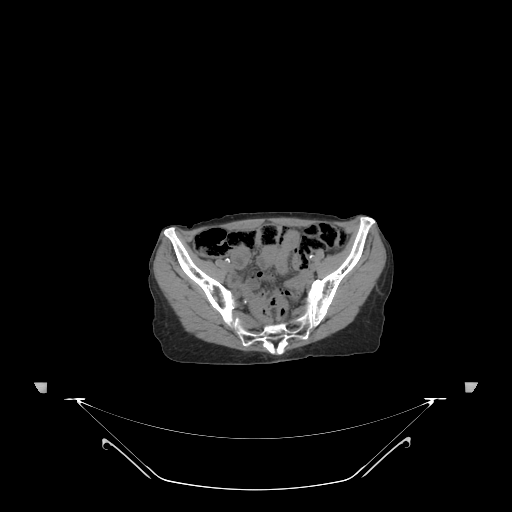
[im 7/104  bone]
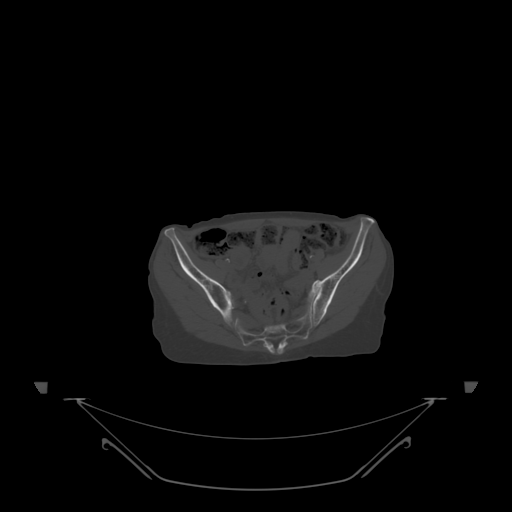
[im 14/104  soft-tissue]
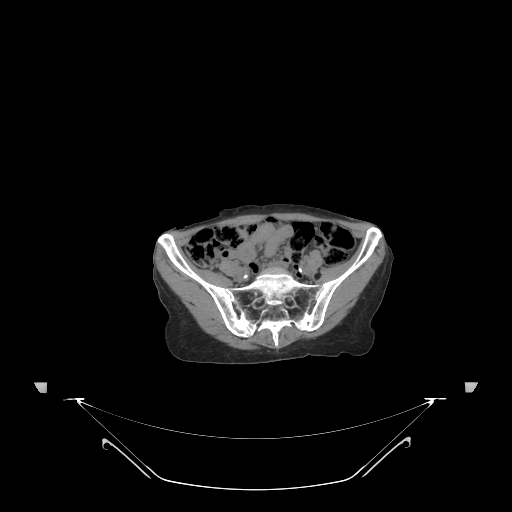
[im 20/104  soft-tissue]
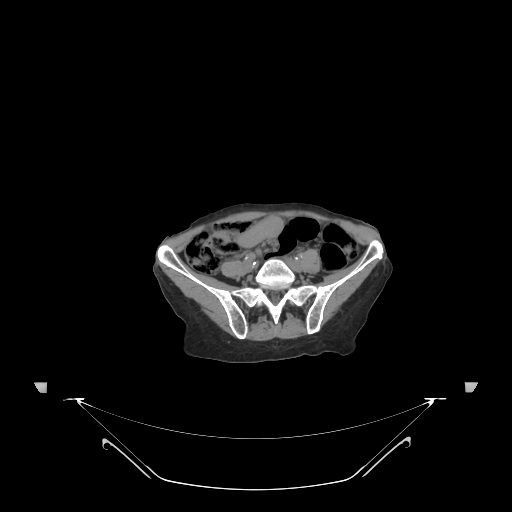
[im 27/104  soft-tissue]
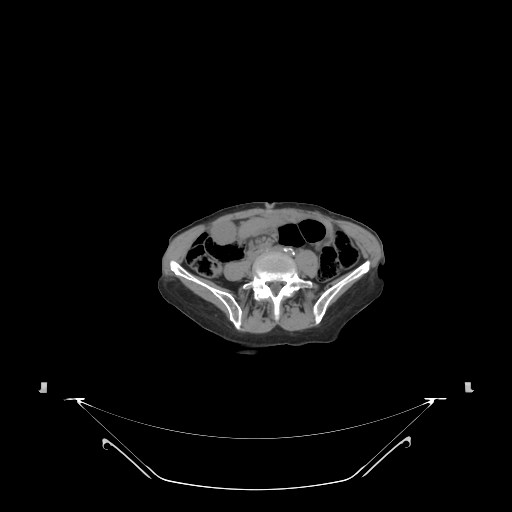
[im 34/104  soft-tissue]
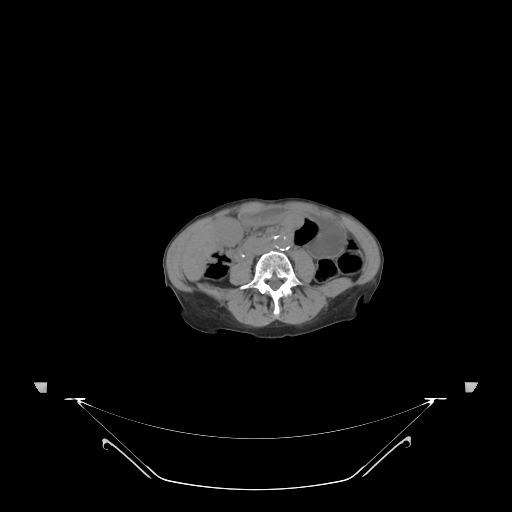
[im 40/104  soft-tissue]
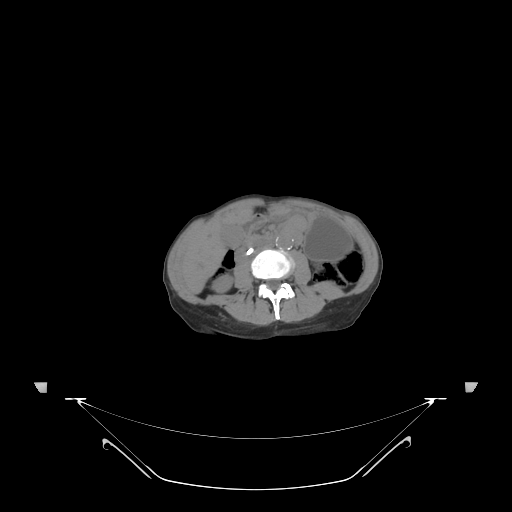
[im 47/104  soft-tissue]
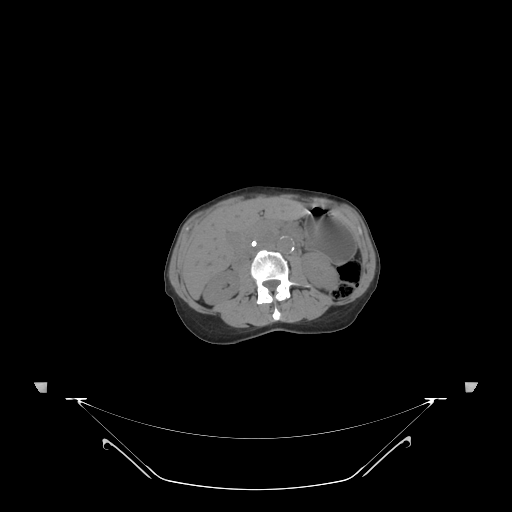
[im 57/104  soft-tissue]
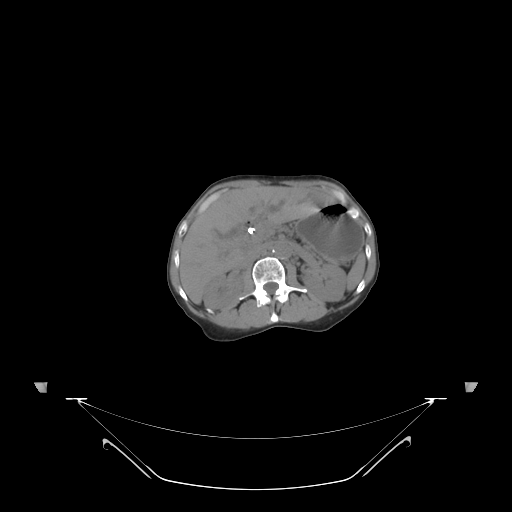
[im 64/104  soft-tissue]
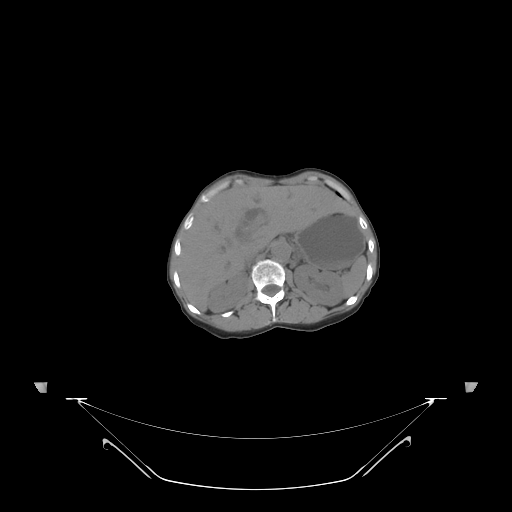
[im 64/104  bone]
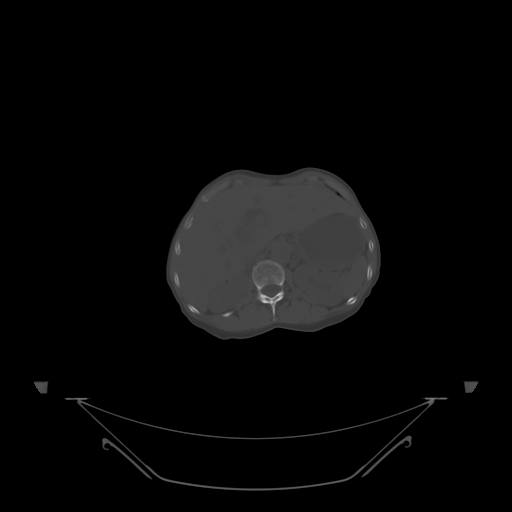
[im 70/104  soft-tissue]
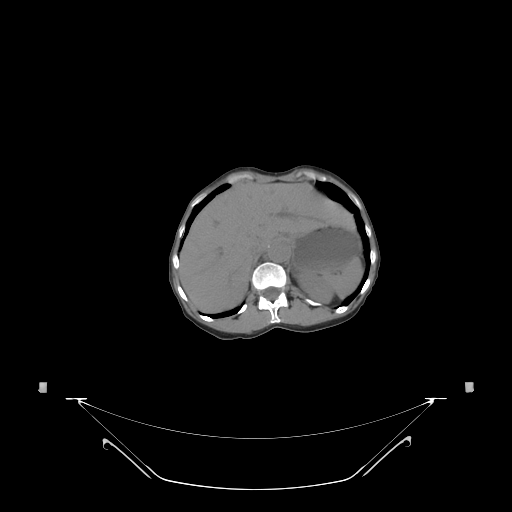
[im 77/104  soft-tissue]
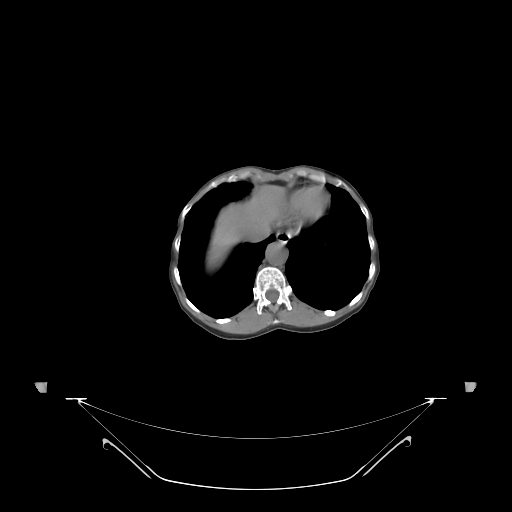
[im 84/104  soft-tissue]
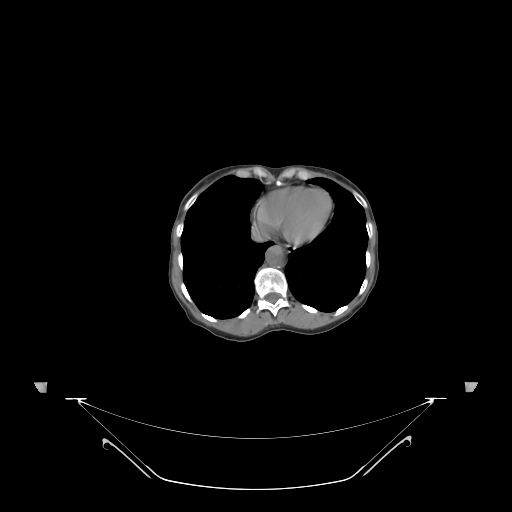
[im 90/104  soft-tissue]
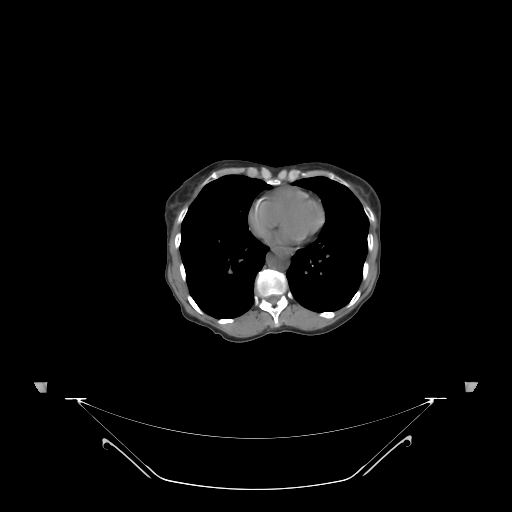
[im 90/104  lung]
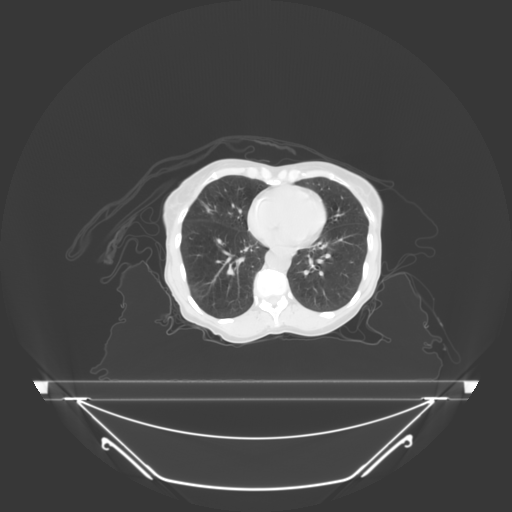
[im 94/104  lung]
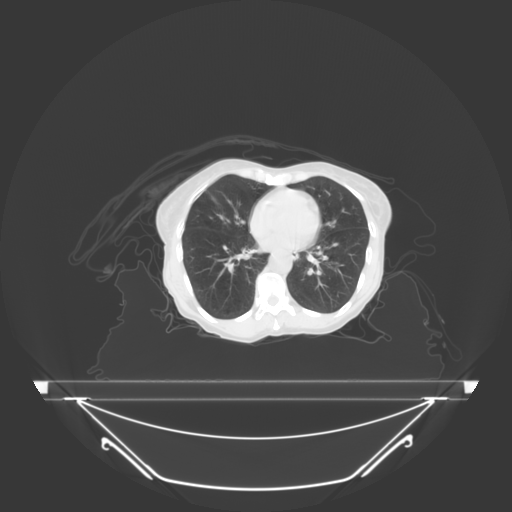
[im 97/104  soft-tissue]
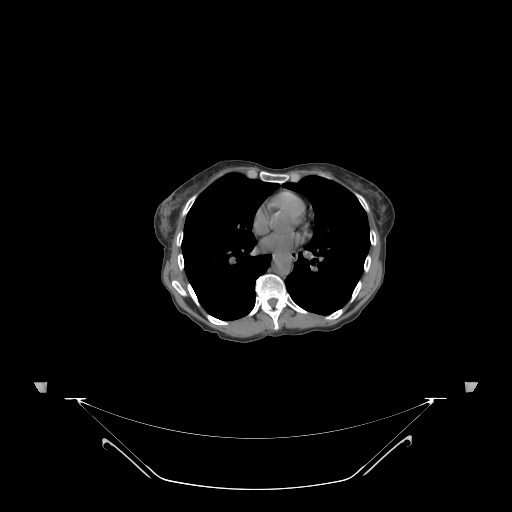
[im 97/104  lung]
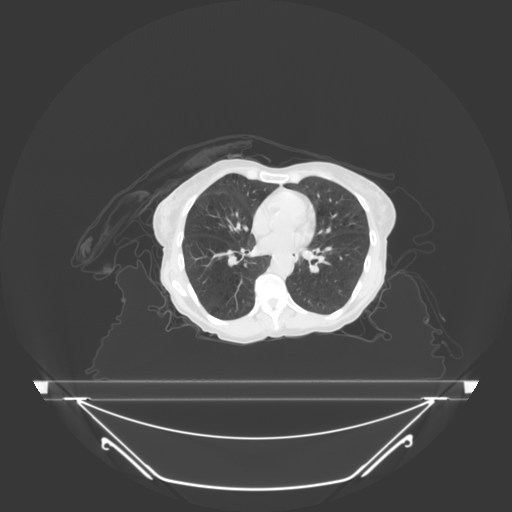
[im 100/104  lung]
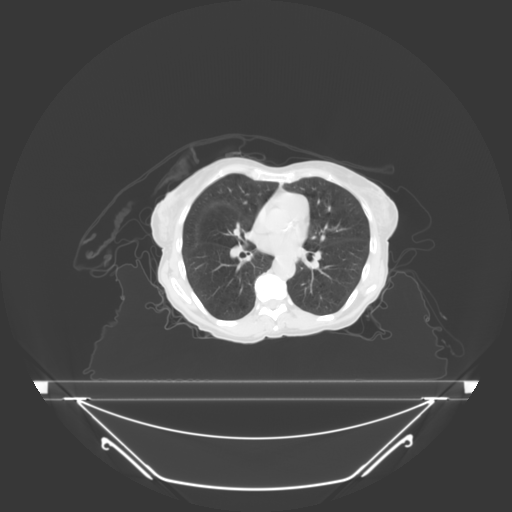

[16 of 32 positions shown; findings below may reference images not displayed]

IMAGES IMPORTED FROM THE [employer] WORKFLOW SYSTEM
NO DICTATION FOR STUDY

## 2014-01-31 IMAGING — CT CT CHEST-ABD-PELV W/ CM
1 of 2 series · 13 of 29 positions shown, 18 images · non-contrast
Comparison: none

REASON FOR EXAM: (1) Restaging pancreatic cancer; (2) Restaging
pancreatic cancer
COMMENTS:

PROCEDURE:     CT  - CT CHEST ABDOMEN AND PELVIS W  - March 20, 2013  [DATE]
RESULT:
Comparison is made to a prior study dated 12/24/2012.
TECHNIQUE: Helical 3 mm sections were obtained from the thoracic inlet
through the pubic symphysis status post intravenous administration of 85 ml
of Csovue-GGG and oral contrast.

[Series 2: soft tissue · axial · 0.60mm/px · z∈[-508,+2]mm · 13 of 192 slices shown, 18 images]
[im 11/192  mediastinal]
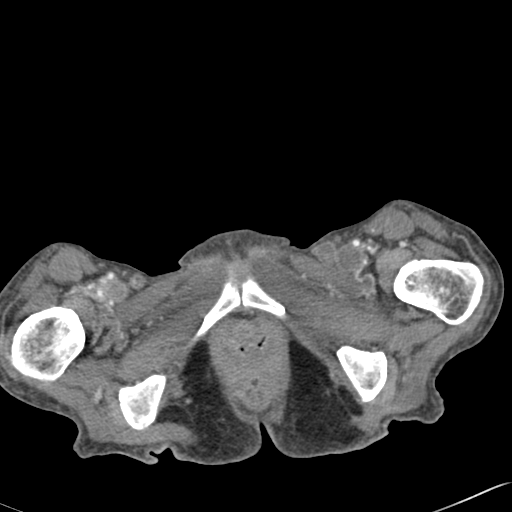
[im 11/192  bone]
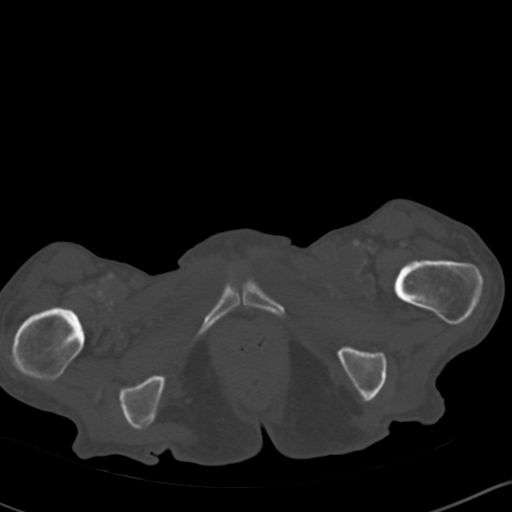
[im 32/192  mediastinal]
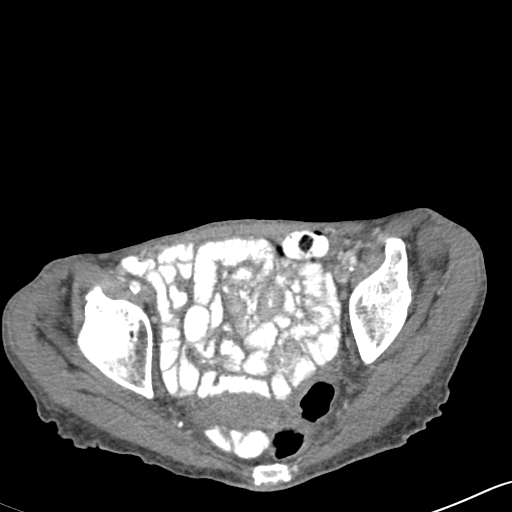
[im 43/192  mediastinal]
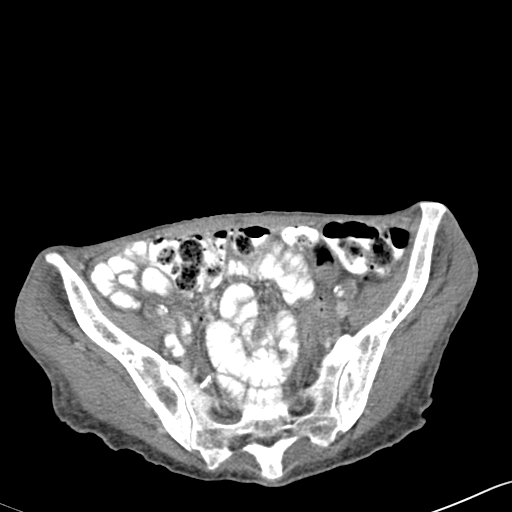
[im 64/192  mediastinal]
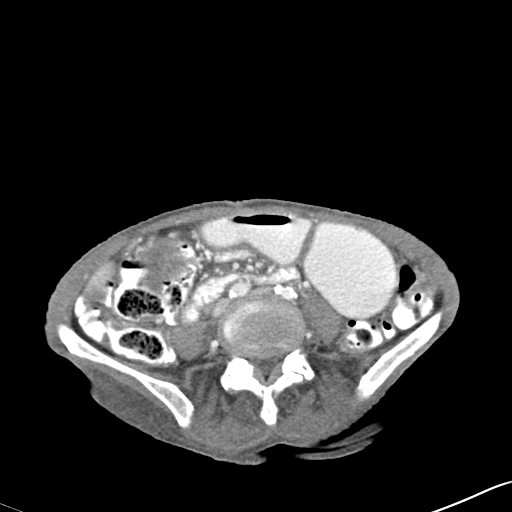
[im 75/192  mediastinal]
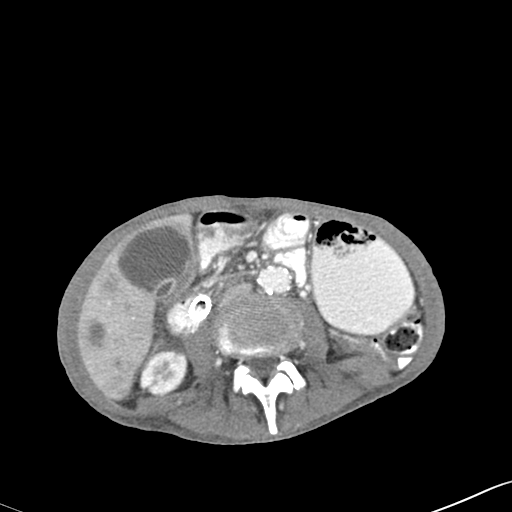
[im 94/192  mediastinal]
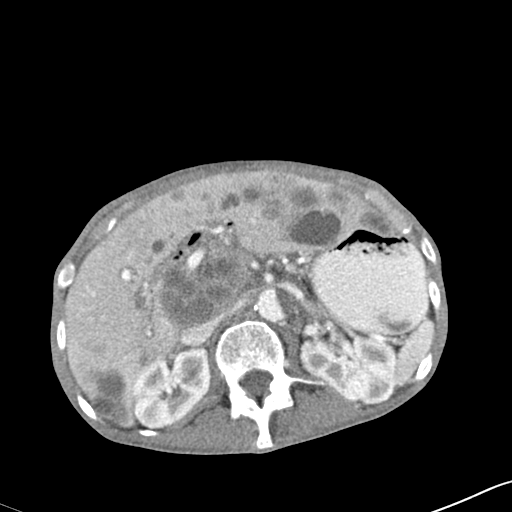
[im 96/192  mediastinal]
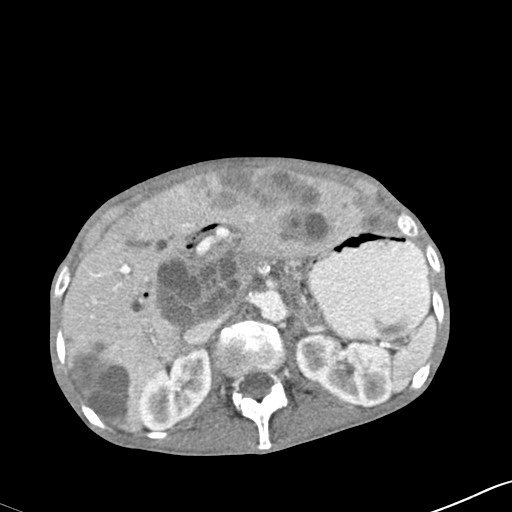
[im 117/192  mediastinal]
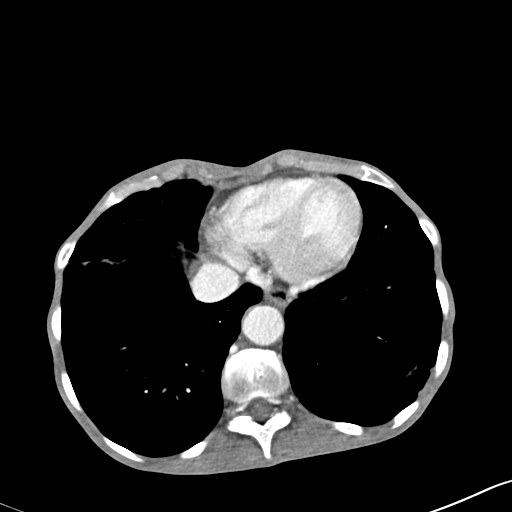
[im 128/192  mediastinal]
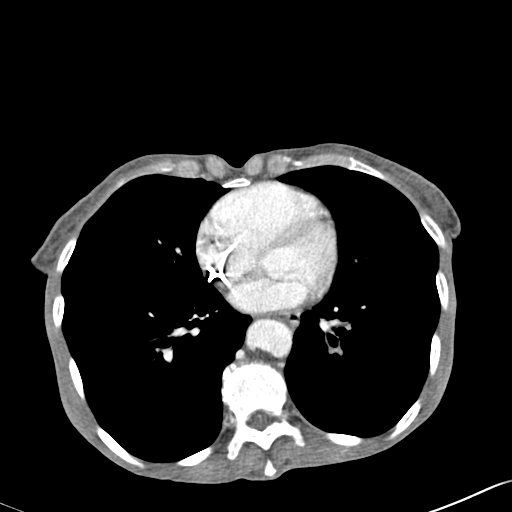
[im 128/192  bone]
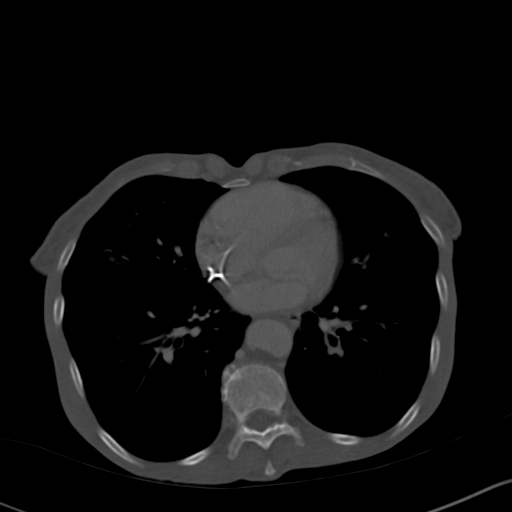
[im 149/192  mediastinal]
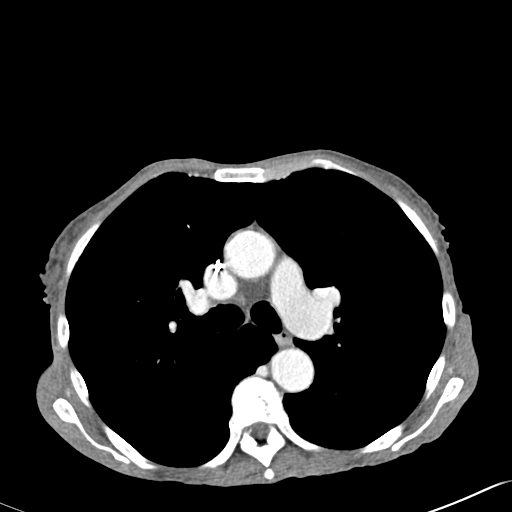
[im 149/192  lung]
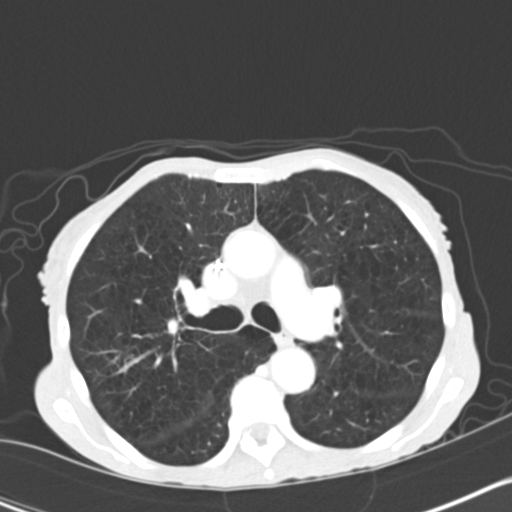
[im 160/192  mediastinal]
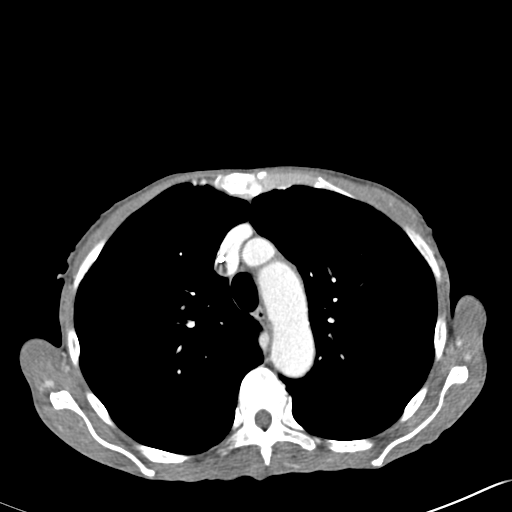
[im 160/192  lung]
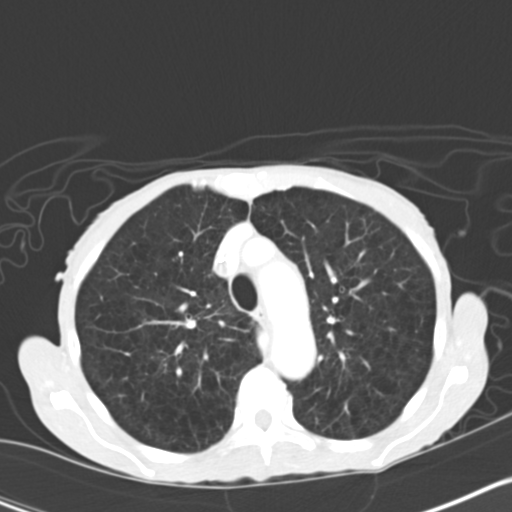
[im 170/192  lung]
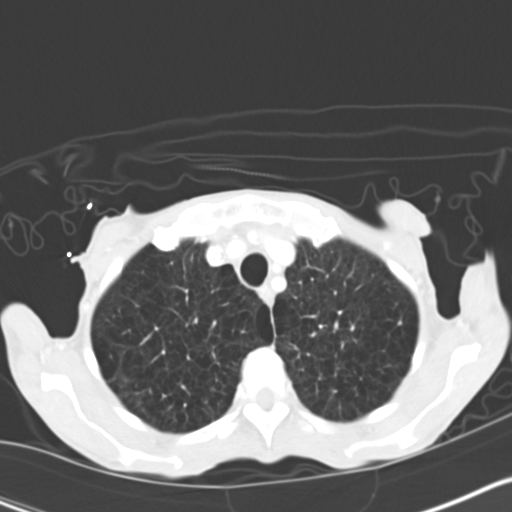
[im 181/192  mediastinal]
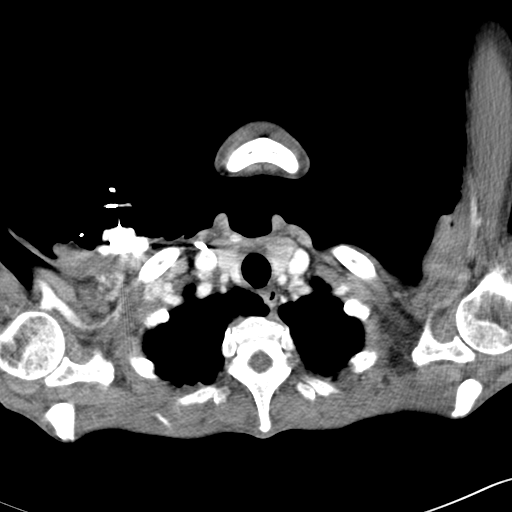
[im 181/192  lung]
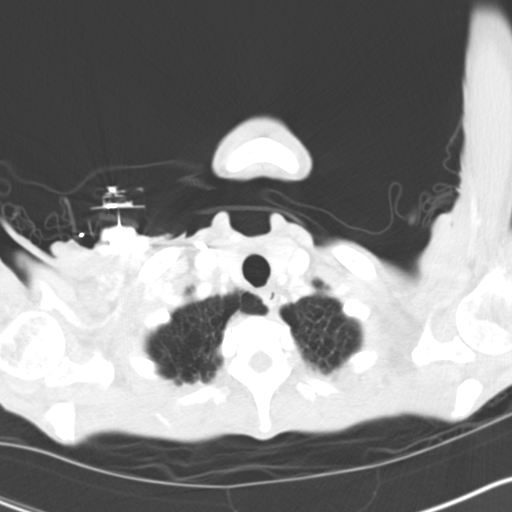

[13 of 29 positions shown; findings below may reference images not displayed]

FINDINGS: Evaluation of the mediastinum and hilar regions and structures
demonstrates no evidence of mediastinal or hilar adenopathy or masses.

A filling defect is identified within the left lower lobe pulmonary artery.
There is extension into subsegmental vessels.

The lung parenchyma demonstrates diffuse emphysematous and fibrotic changes.
These findings appear to be moderate.

In the interim, there has been development of a linear area of increased
density along the minor fissure measuring 3.71 x 0.69 cm in transverse
dimensions. A second area of increased density with a cystic component
projects along the lateral periphery of the right lung base extending from
the lateral basal segment to the medial basal segment. This has a soft
tissue component.

The liver demonstrates diffuse multiple partially cystic masses throughout
the liver consistent with cystic metastatic disease until proven otherwise.
These findings have increased in numbers and conspicuity and the sizes of
the nodules have increased. A large cystic nodule projects in the region of
the gallbladder fossa. This appears to be adjacent to the gallbladder. A
biliary stent is identified and the intrahepatic as well as the extrahepatic
biliary system appears decompressed. A cystic appearing mass projects in the
region of the head and uncinate process of the pancreas.

There is no evidence of a thoracic or abdominal aortic aneurysm. The celiac,
SMA, IMA, and portal vein are opacified.

The spleen, kidneys, and adrenals are unremarkable.

There is no CT evidence of bowel obstruction, enteritis or colitis.
IMPRESSION: 1.  Findings consistent with a left lower lobe pulmonary arterial embolus.
2.  Increased disease burden within the liver and possibly the pancreas.
3.  Dr. Arana of the Oncology Service was informed of these findings at
the time of initial interpretation.

## 2014-02-01 IMAGING — XA IR VASCULAR PROCEDURE
3 series · 15 of 17 positions shown · IV contrast (IODINE)
Comparison: none

[Series 1: ivc · 3 acquisitions, 5 frames shown (1 of 3)]
[im 1/3  full-range]
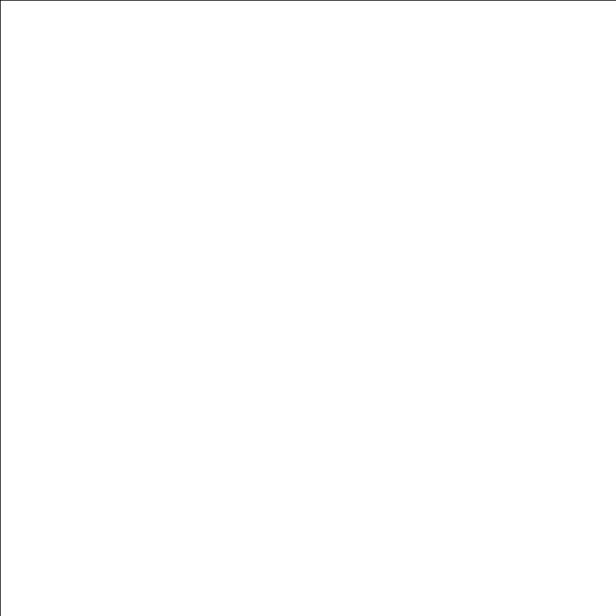
[im 1/3]
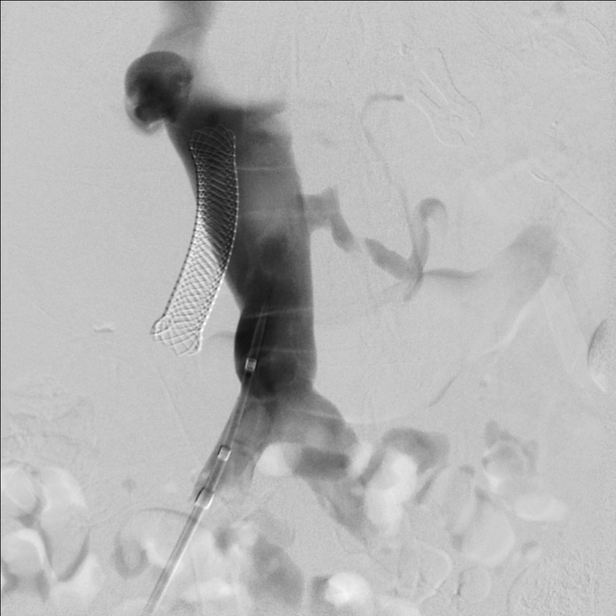
[im 1/3]
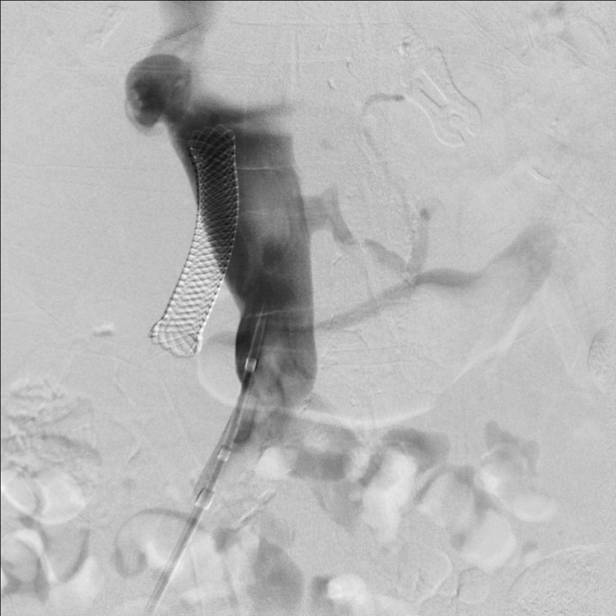
[im 1/3]
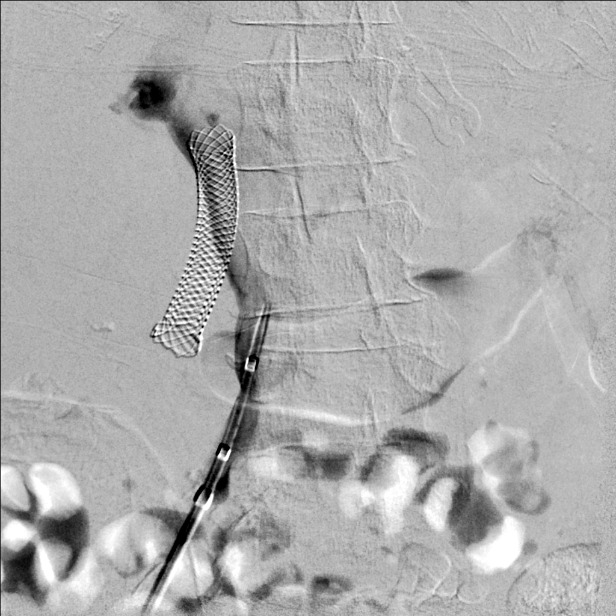
[im 3/3]
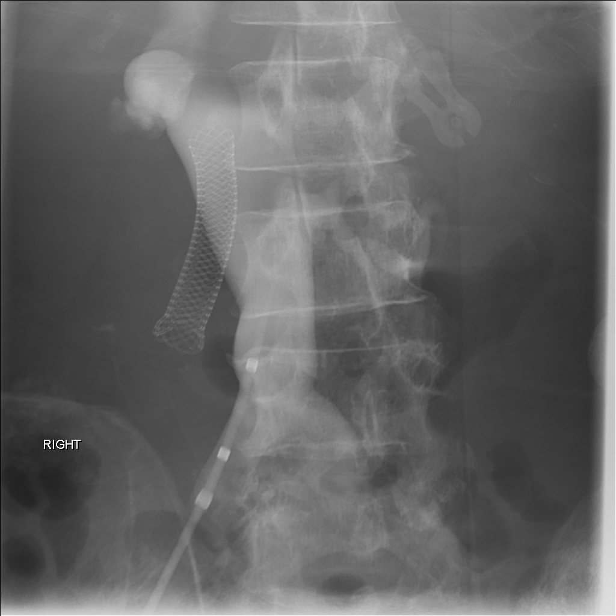

[Series 2: ivc · 3 acquisitions, 5 frames shown (2 of 3)]
[im 1/3  full-range]
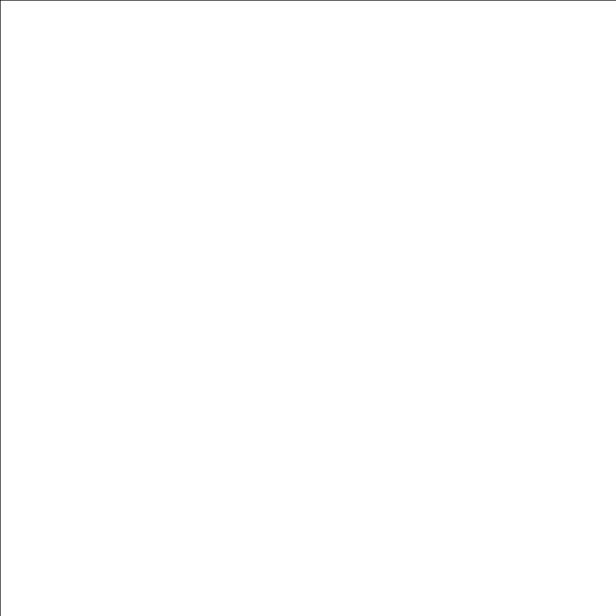
[im 1/3]
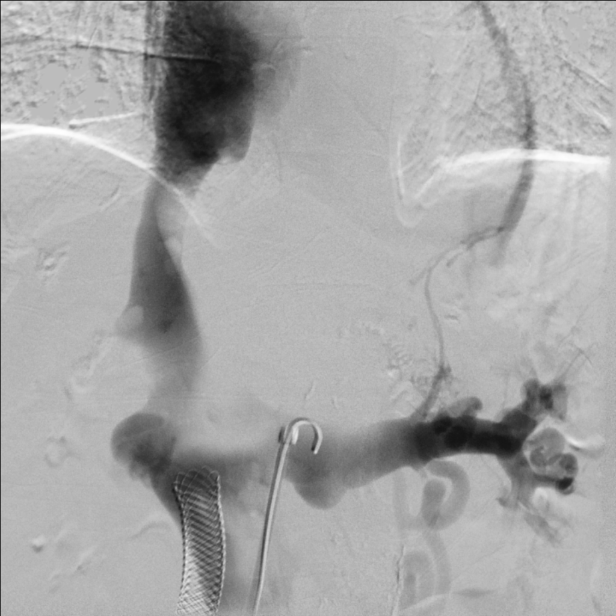
[im 1/3]
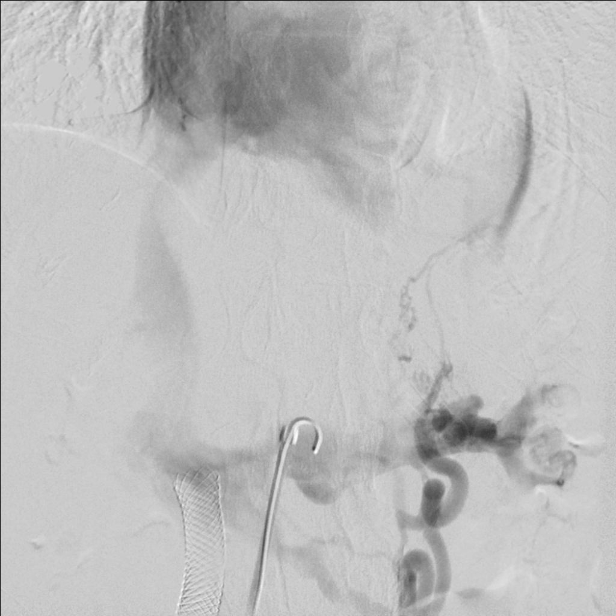
[im 2/3]
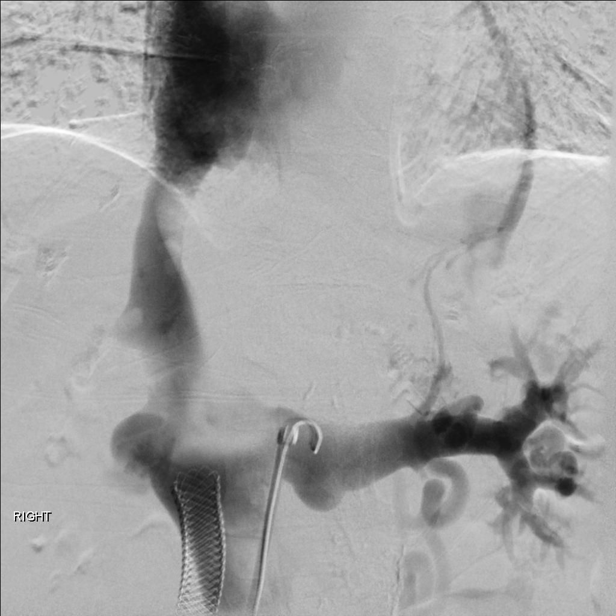
[im 3/3]
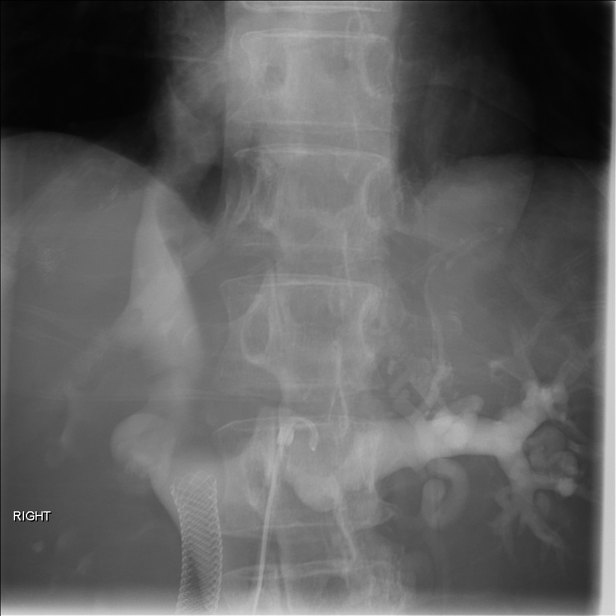

[Series 3: ivc · 3 acquisitions, 5 frames shown (3 of 3)]
[im 1/3  full-range]
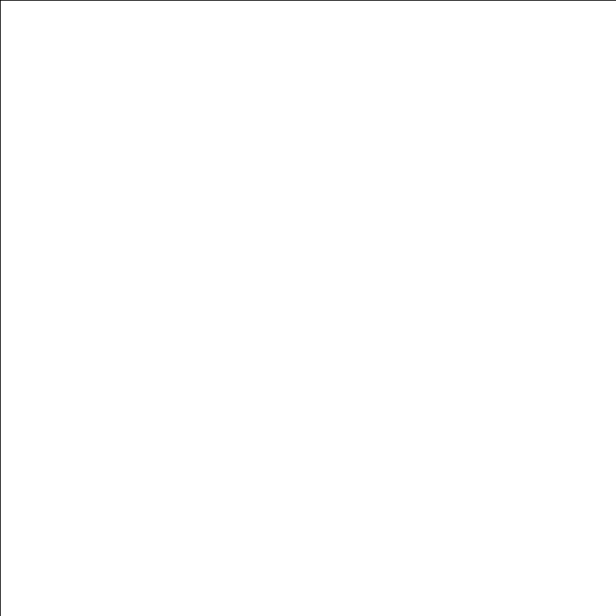
[im 1/3]
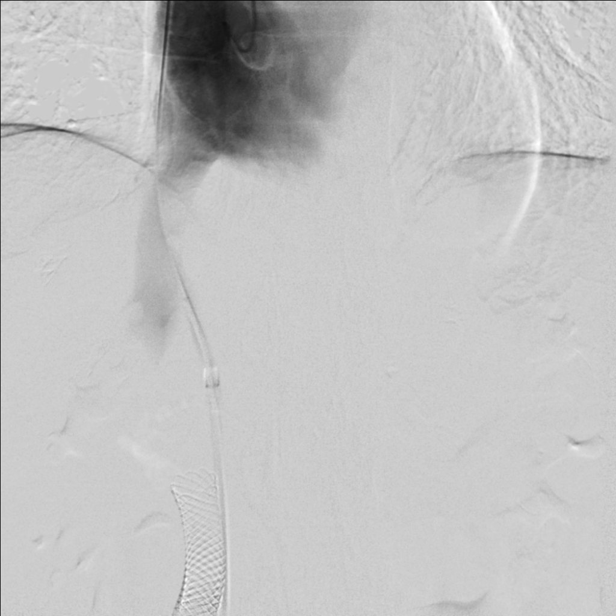
[im 1/3]
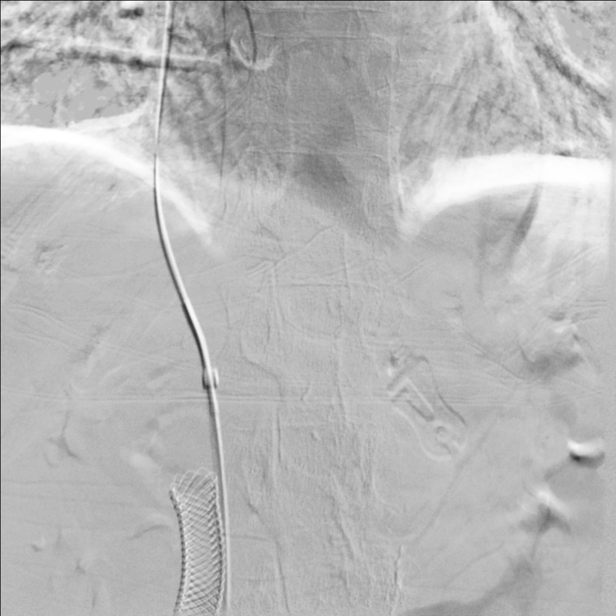
[im 2/3]
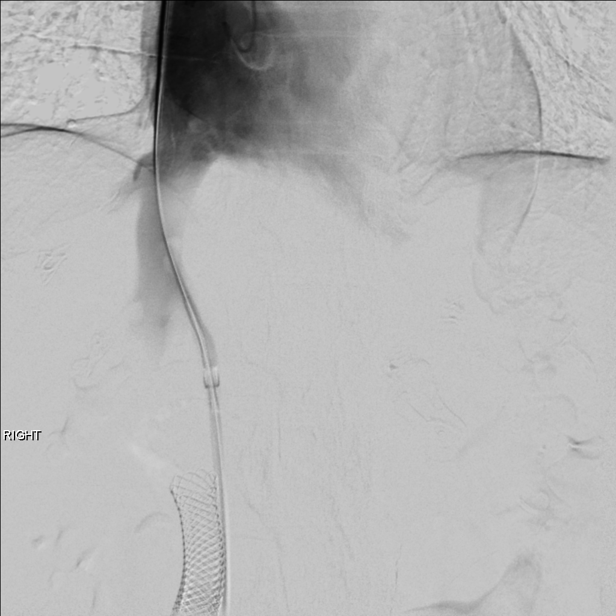
[im 3/3]
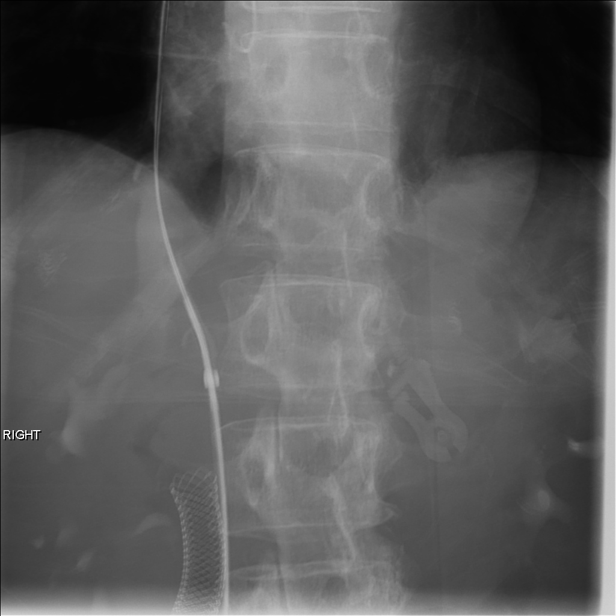

[15 of 17 positions shown; findings below may reference images not displayed]

IMAGES IMPORTED FROM THE [employer] WORKFLOW SYSTEM
NO DICTATION FOR STUDY

## 2015-01-12 NOTE — Consult Note (Signed)
History of Present Illness:   Reason for Consult Pancreatic mass, concerning for underlying malignancy.    HPI   Patient is a 74 year old female who for the past several weeks has noted intermittent back pain as well as a significant darkening of her urine.  She otherwise has felt well, but does admit to about a 5 pound weight loss over that time frame.  Over the past several days, she noted her skin and eyes turning yellow.  Currently, patient otherwise feels well.  She has no neurologic complaints.  She denies any recent fevers.  She has no chest pain or shortness of breath.  She denies any nausea, vomiting, constipation, or diarrhea. She has no further urinary complaints.  Patient otherwise feels well and offers no further specific complaints.  PFSH:   Additional Past Medical and Surgical History Hypertension, COPD.  Family history: CAD, COPD, colon cancer.  Social history: Positive tobacco, one pack per day for greater than 50 years.  No report of alcohol use.   Review of Systems:   Performance Status (ECOG) 0    Review of Systems   As per HPI. Otherwise, 10 point system review was negative.   NURSING NOTES: **Vital Signs.:   31-Dec-13 04:00    Vital Signs Type: Routine    Temperature Temperature (F): 97.7    Celsius: 36.5    Temperature Source: Oral    Pulse Pulse: 77    Respirations Respirations: 20    Systolic BP Systolic BP: 563    Diastolic BP (mmHg) Diastolic BP (mmHg): 67    Mean BP: 80    Pulse Ox % Pulse Ox %: 92    Pulse Ox Activity Level: At rest    Oxygen Delivery: Room Air/ 21 %   Physical Exam:   Physical Exam General: Well-developed, well-nourished, no acute distress. Eyes: Pink conjunctiva, anicteric sclera. HEENT: Normocephalic, moist mucous membranes, clear oropharnyx. Lungs: Clear to auscultation bilaterally. Heart: Regular rate and rhythm. No rubs, murmurs, or gallops. Abdomen: Soft, nontender, nondistended. No organomegaly noted,  normoactive bowel sounds. Musculoskeletal: No edema, cyanosis, or clubbing. Neuro: Alert, answering all questions appropriately. Cranial nerves grossly intact. Skin: No rashes or petechiae noted. Psych: Normal affect. Lymphatics: No cervical, calvicular, axillary or inguinal LAD.    No Known Allergies:     enalapril 5 mg oral tablet: 1 tab(s) orally once a day (in the evening), Active, 0, None   ProAir HFA 90 mcg/inh inhalation aerosol: 1 puff(s) inhaled 4 times a day, As Needed- for Shortness of Breath , Active, 0, None  Laboratory Results: Hepatic:  30-Dec-13 15:54    Bilirubin, Total  13.7   Alkaline Phosphatase  628   SGPT (ALT)  665   SGOT (AST)  477   Total Protein, Serum 7.9   Albumin, Serum 3.8  Routine Chem:  30-Dec-13 15:54    Glucose, Serum  200   BUN 10   Creatinine (comp) 0.94   Sodium, Serum  135   Potassium, Serum 3.7   Chloride, Serum 99   CO2, Serum 27   Calcium (Total), Serum 9.6   Osmolality (calc) 275   eGFR (African American) >60   eGFR (Non-African American) >60 (eGFR values <68m/min/1.73 m2 may be an indication of chronic kidney disease (CKD). Calculated eGFR is useful in patients with stable renal function. The eGFR calculation will not be reliable in acutely ill patients when serum creatinine is changing rapidly. It is not useful in  patients on dialysis. The eGFR calculation may  not be applicable to patients at the low and high extremes of body sizes, pregnant women, and vegetarians.)   Anion Gap 9   Lipase 347 (Result(s) reported on 23 Sep 2012 at 04:39PM.)  Routine UA:  30-Dec-13 15:54    Color (UA) Red   Clarity (UA) Cloudy   Glucose (UA) 150 mg/dL   Bilirubin (UA) 2+   Ketones (UA) Trace   Specific Gravity (UA) 1.015   Blood (UA) Negative   pH (UA) 6.0   Protein (UA) 30 mg/dL   Nitrite (UA) Negative   Leukocyte Esterase (UA) Trace (Result(s) reported on 23 Sep 2012 at 04:43PM.)   RBC (UA) 3 /HPF   WBC (UA) 26 /HPF   Bacteria  (UA) TRACE   Epithelial Cells (UA) 3 /HPF   Mucous (UA) PRESENT   Hyaline Cast (UA) 25 /LPF   Cellular Cast (UA) 27 /LPF (Result(s) reported on 23 Sep 2012 at 04:43PM.)  Routine Hem:  30-Dec-13 15:54    WBC (CBC) 7.9   RBC (CBC) 5.03   Hemoglobin (CBC) 14.8   Hematocrit (CBC) 44.3   Platelet Count (CBC) 204 (Result(s) reported on 23 Sep 2012 at 04:33PM.)   MCV 88   MCH 29.4   MCHC 33.4   RDW  14.6   Radiology Results: CT:    31-Dec-13 09:13, CT Abdomen WWO Contrast   CT Abdomen WWO Contrast    REASON FOR EXAM:    obstructive jaundice and pancreatic tumor;    NOTE:   Nursing to Give Oral CT Cont  COMMENTS:   LMP: Post-Menopausal    PROCEDURE: CT  - CT ABDOMEN STANDARD W/WO  - Sep 24 2012  9:13AM     RESULT:     Technique: Helical 3 mm sections were obtained from the lung bases   through the superior iliac crest pre- and post intravenous administration   of 75 mL of Isovue-300 and status post administration of oral contrast.    Findings: The lung bases are unremarkable.    Moderate to severe intrahepatic biliary ductal dilatation is identified     as well as extrahepatic biliary ductal dilatation. The common bile duct   measures 1.15 cm in diameter. The common bile duct appears to be dilated   to the level of the head of the pancreas. Within the uncinate process   mildly heterogeneously enhancing partially cystic-appearing mass is   identified measuring 2.16 x 2.97 cm. This finding is consistent with an   uncinate process mass until proven otherwise. Further evaluation with   endoscopic ultrasound is recommended.    No definite liver masses are identified in the regions of abnormal   parenchymal enhancement. The kidneys, spleen, adrenals are unremarkable.   There is no CT evidence of bowel obstruction, enteritis, colitis,   diverticulitis, nor appendicitis.    There is no evidence of an abdominal aortic aneurysm. The celiac, SMA,   IMA, portal vein are opacified.    IMPRESSION:      1. Findings consistent with a mass in the uncinate process of the   pancreas with associated mass effect upon the common bile duct and   intrahepatic and extrahepatic biliary ductal dilatation. Further   evaluation of these findings with endoscopic ultrasound is recommended.     Thank you for the opportunity to contribute to the care of your patient.         Verified By: Mikki Santee, M.D., MD   Assessment and Plan:  Impression:   Pancreatic  mass, concerning for underlying malignancy.  Plan:   1.  Pancreatic mass: GI has been consulted and patient will likely need ERCP for further evaluation and biopsy.  If this cannot be accomplished, she may require EUS.  CA 19-9 is pending.  Will get PET scan later this week for completeness.  If patient is discharged over the next several days, she should followup in the [facility] in approximately one to 2 weeks to discuss pathology as well as imaging results.   consult, will follow.  Electronic Signatures: Delight Hoh (MD)  (Signed 31-Dec-13 16:45)  Authored: HISTORY OF PRESENT ILLNESS, PFSH, ROS, NURSING NOTES, PE, ALLERGIES, HOME MEDICATIONS, LABS, OTHER RESULTS, ASSESSMENT AND PLAN   Last Updated: 31-Dec-13 16:45 by Delight Hoh (MD)

## 2015-01-12 NOTE — H&P (Signed)
PATIENT NAME:  Hannah Pennington, Hannah Pennington MR#:  [id number] DATE OF BIRTH:  Nov 21, 1940  DATE OF ADMISSION:  09/23/2012  PRIMARY CARE PHYSICIAN: Helane RimaErica Wallace, MD  REFERRING PHYSICIAN: Dorothea GlassmanPaul Malinda, MD  CHIEF COMPLAINT: Jaundice and back pain.   HISTORY OF PRESENT ILLNESS: Hannah Pennington is a 74 year old African American female with history of hypertension. Recently she noticed that she has dark red urine and she has anorexia and she has 5 pound weight loss over the last 3 to 4 weeks. She saw her primary care physician who ordered blood work-up and that revealed evidence of obstructive jaundice with bilirubin reaching 11 and direct bilirubin 7.9 along with elevated liver transaminases. She was directed to have ultrasound and ultrasound today showed obstructive pattern with tumor of the head of the pancreas.   REVIEW OF SYSTEMS:  CONSTITUTIONAL: Denies any fever. No chills. No fatigue.   EYES: No blurring of vision. No double vision.   ENT: No hearing impairment. No sore throat. No dysphagia.   CARDIOVASCULAR: No chest pain. No shortness of breath. No edema. No syncope.   RESPIRATORY: No cough. No shortness of breath. No chest pain.   GASTROINTESTINAL: Reports no vomiting and no diarrhea, but she has some anorexia lately and few pounds of weight loss, about 5 pounds.   GENITOURINARY: No dysuria. No frequency of urination.   MUSCULOSKELETAL: No joint pain or swelling other than her mid back pain. No muscular pain or swelling.   INTEGUMENTARY: No skin rash but she has icteric skin and yellow sclerae. No ulcers.   NEUROLOGY: No focal weakness. No seizure activity. No headache.   PSYCHIATRY: No anxiety. No depression.   ENDOCRINE: No polyuria or polydipsia. No heat or cold intolerance.   HEMATOLOGY: No easy bruisability. No lymph node enlargement.   PAST MEDICAL HISTORY:  1. Systemic hypertension.  2. Chronic obstructive pulmonary disease. 3. Tobacco abuse.   SOCIAL HABITS: Chronic smoker,  1 pack per day since age of 74. She does not drink alcohol, only occasional beer. No drug abuse.   FAMILY HISTORY: She does not have specific information about her father other than he had a bad heart. Her mother died from complication of chronic obstructive pulmonary disease and colon cancer.   SOCIAL HISTORY: She is married and living with her husband. 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  ADMISSION MEDICATIONS:  1. ProAir HFA inhaler 4 times a day p.r.n. 2. Enalapril 5 mg once a day.   ALLERGIES: No known drug allergies.   PHYSICAL EXAMINATION:  VITAL SIGNS: Blood pressure 160/84, respiratory rate 18, pulse 82, temperature 97.9 and oxygen saturation 96%.   GENERAL APPEARANCE: Elderly female lying in bed in no acute distress, thin looking.   HEENT: The patient has very obvious icterus. No cyanosis. No pallor. Ear examination revealed normal hearing, no discharge and no lesions. Nasal mucosa was normal. No ulcers and no discharge. Oropharyngeal area showed no oral thrush, no exudates and no ulcers. She is edentulous and she has dentures of upper and lower jaw. Eye examination revealed normal eyelids. She has icteric conjunctivae. Pupils are about 4 mm, equal and sluggishly reactive to light.   NECK: Supple. Trachea at midline. No thyromegaly. No cervical lymphadenopathy. No masses.   CARDIAC:  Normal S1, S2. No S3, S4. No murmur. No gallop. No carotid bruits.   RESPIRATORY: Normal breathing pattern without use of accessory muscles. No rales and no wheezing.   ABDOMEN: Soft. There is some discomfort at the epigastric area upon  deep pressure. No hepatosplenomegaly. No masses. No hernias.   MUSCULOSKELETAL: No joint swelling. No clubbing.   SKIN: No ulcers. No subcutaneous nodules.   NEUROLOGIC: Cranial nerves II through XII are intact. No focal motor deficit.   PSYCHIATRIC: The patient is alert and oriented x 3. Mood and affect were normal.   LABS/RADIOLOGIC STUDIES:  Ultrasound of the abdomen revealed abnormal echotexture within the pancreatic head/porta hepatis region concerning for malignant disease. There is common bile duct dilatation with intrahepatic ductal dilatation. The common bile duct measures up to 1.3 cm. The pancreatic duct is distended to 3.3 mm. Findings are most concerning for pancreatic malignancy with regional metastatic involvement. There is some compression of the inferior vena cava without definite thrombus. Gallbladder is filled with sludge.   Serum glucose 200, BUN 10, creatinine 0.9, sodium 135, potassium 3.7 and lipase 347. Total protein was 7.9 and albumin 3.8. Total bilirubin was 13.7. Alkaline phosphatase is 628, AST 477 and ALT 665. Of note, she had blood work-up done on outpatient basis at another lab showing total bilirubin 11 with direct bilirubin being 7.9. CBC showed white count of 7900, hemoglobin 14.8, hematocrit 44, platelet count 204 and MCV 88.   Urinalysis showed cloudy urine. There are 26 white blood cells and 3 RBCs.   ASSESSMENT: 1. Obstructive jaundice. 2. Pancreatic head tumor suspicious for malignancy.  3. Anorexia and weight loss. 4. Sludge in the gallbladder, bile.  5. Urinary tract infection.  6. Systemic hypertension.  7. Tobacco abuse.  8. Elevated blood sugar reaching 200. May indicate underlying diabetes.   PLAN: We will admit to the medical floor in consultation with gastroenterology and also with oncology. Dr. Orlie Dakin was contacted and he recommended to check her CA-19-9. I ordered a CAT scan of the abdomen, urine for culture and sensitivity and I started the patient on ciprofloxacin 500 mg p.o. twice a day. I had a discussion with the patient and also her family in her room and I answered their questions. For deep venous thrombosis prophylaxis, we will place the patient on heparin subcutaneous 5000 twice a day. Regarding a Living Will, the patient indicated that she does not have a Living Will, but her  code status is FULL CODE.   TIME SPENT EVALUATING THIS PATIENT: More than 55 minutes.  ____________________________ Carney Corners. Rudene Re, MD amd:sb D: 09/24/2012 00:40:00 ET Pennington: 09/24/2012 08:39:41 ET JOB#: [id number]  cc: Carney Corners. Rudene Re, MD, <Dictator> Zollie Scale MD ELECTRONICALLY SIGNED 09/24/2012 21:35

## 2015-01-12 NOTE — Consult Note (Signed)
Brief Consult Note: Diagnosis: Pancreatic mass.   Patient was seen by consultant.   Comments: Pancreatic head mass with obstructive jaundice most likely malignant.  Recommendations: ERCP with srenting and sampling. Procedure along with its complications discussed with her and she is in full agreement. The post ERCP pancreatitis was discussed in detail as well along with the fact that some cases may be fatal. Alternatives discussed as well. CA 19-9. Appreciate oncology consult.  Will follow.  Electronic Signatures: Lurline DelIftikhar, Mina Carlisi (MD)  (Signed 31-Dec-13 18:59)  Authored: Brief Consult Note   Last Updated: 31-Dec-13 18:59 by Lurline DelIftikhar, Romell Cavanah (MD)

## 2015-01-15 NOTE — Op Note (Signed)
PATIENT NAME:  Hannah Pennington, Hannah Pennington MR#:  [id number] DATE OF BIRTH:  1941/04/19  DATE OF PROCEDURE:  12/04/2012  PREOPERATIVE DIAGNOSIS:  Pancreatic cancer with poor venous access.   POSTOPERATIVE DIAGNOSIS:  Pancreatic cancer with poor venous access.   PROCEDURES:  1.  Ultrasound guidance for vascular access, right jugular vein.  2.  Fluoroscopic guidance for placement of catheter.  3.  Placement of a CT compatible Infuse-a-Port, right jugular vein.   SURGEON:  Annice NeedyJason S. Dew, MD   ANESTHESIA:  Local with moderate conscious sedation.   ESTIMATED BLOOD LOSS:  Minimal.   FLUOROSCOPY TIME:  Less than 1 minute.   CONTRAST USED:  None.   INDICATION FOR PROCEDURE:  A 74 year old [place] female with pancreatic cancer. She needs a Port-A-Cath for chemotherapy as she has not been able to get her current treatments with peripheral IVs at this point. Risks and benefits were discussed. Informed consent was obtained.   DESCRIPTION OF PROCEDURE:  The patient was brought to the vascular and interventional radiology suite. The right neck and chest were sterilely prepped and draped and a sterile surgical field was created. The right jugular vein was visualized with ultrasound and found to be widely patent. It was then accessed under direct ultrasound guidance without difficulty with a Seldinger needle. A J-wire was placed. After skin nick and dilatation, the peel-away sheath was placed into the vein and the wire and dilator were thenremoved. I then anesthetized an area 2 fingerbreadths below the right clavicle. A transverse incision was created and an inferior pocket was created with electrocautery and blunt dissection. The port was secured to the chest wall with 2 Prolene sutures connected to the catheter and tunneled from the subclavicular incision to the access site. Fluoroscopic guidance then used to cut the catheter to an appropriate length. It was then placed through the peel-away sheath and the  peel-away sheath was removed. The catheter tip was parked in excellent location in the distal superior vena cava. It withdrew blood well and flushed easily with heparinized saline. The wound was then irrigated with antibiotic-impregnated saline and closed with a running 3-0 Vicryl and 4-0 Monocryl and the access incision was closed with a single 4-0 Monocryl. Dermabond was placed as a dressing. The patient tolerated the procedure well and was taken to the recovery room in stable condition.    ____________________________ Annice NeedyJason S. Dew, MD jsd:si D: 12/05/2012 16:12:52 ET Pennington: 12/05/2012 23:48:06 ET JOB#: [id number]  cc: Annice NeedyJason S. Dew, MD, <Dictator> Annice NeedyJASON S DEW MD ELECTRONICALLY SIGNED 12/09/2012 11:55

## 2015-01-15 NOTE — Discharge Summary (Signed)
PATIENT NAME:  Hannah Pennington, Hannah Pennington MR#:  [id number] DATE OF BIRTH:  1940-11-03  DATE OF ADMISSION:  01/11/2013 DATE OF DISCHARGE:  01/13/2013  PRIMARY CARE PHYSICIAN: Alcario Drought R. Earlene Plater, DO  FINAL DIAGNOSES:  1.  Acute hemorrhagic anemia.  2.  Gastrointestinal bleed, likely upper in nature.  3.  Chronic obstructive pulmonary disease.  4.  Stage IV pancreatic cancer and thrombocytopenia.  5.  Tobacco abuse.  6.  Chronic back pain. Advised to stop taking Aleve. No aspirin, Aleve, Motrin, Advil, BC powder.   MEDICATIONS ON DISCHARGE: Include Tussionex Pennkinetic 10 mg/8 mg per 5 mL oral suspension 5 mL twice a day as needed for cough, ProAir HFA 90 mcg per inhalation 2 puffs 4 times a day as needed for shortness of breath, Zofran 8 mg every 8 hours as needed for nausea/vomiting, oxycodone 10 mg every 6 hours as needed for pain, TussiCaps 8 mg/10 mg oral capsule extended-release 1 capsule every 12 hours as needed, enalapril 10 mg twice a day, omeprazole 20 mg twice a day, Ensure clear 200 mL 3 times a day with meals.   DIET: Low-sodium diet, regular consistency.   ACTIVITY: As tolerated.   FOLLOWUP: Keep appointment with Dr. Orlie Dakin. Follow up with Dr. Servando Snare of gastroenterology in 1 week, in 1 to 2 weeks with Dr. Helane Rima.   REASON FOR ADMISSION: The patient was admitted 01/11/2013 and discharged 01/13/2013. The patient came in with GI bleed, frank blood in the commode with a dark stool, and then she passed out. Blood pressure was found to be low. She was admitted with acute posthemorrhagic anemia, GI bleed, was given IV fluids and transfusion.   LABORATORY AND RADIOLOGICAL DATA DURING HOSPITAL COURSE: Included an EKG that showed sinus tachycardia, incomplete right bundle branch block. White blood cell count of 18.5, hemoglobin and hematocrit 7.8 and 23.5, platelet count of 102. INR 1.3. Troponin borderline at 0.07. Glucose 197, BUN 9, creatinine 0.87, sodium 133, potassium 3.5, chloride 110,  CO2 of 25, calcium 8.2, alkaline phosphatase 656, ALT 22, AST 63, total protein 6.8, albumin 2.2. Hemoglobin dipped down to 10, down to 9.9, and then stabilized. Upon discharge, it was 10.8.   HOSPITAL COURSE PER PROBLEM LIST:  1.  For the patient's acute hemorrhagic anemia, she was transfused 2 units of packed red blood cells on presentation on a hemoglobin of 7.8 with active bleeding and syncope. Hemoglobin remained stable after that.  2.  Gastrointestinal bleed, likely upper in nature. The patient was started on Protonix intravenously, seen in consultation by Dr. Servando Snare of gastroenterology. Since the patient stopped bleeding, he was okay with following the patient with medical management and following the patient up as outpatient. No nonsteroidal antiinflammatory drugs advised.  3.  For her chronic obstructive pulmonary disease, that is stable.  4.  For her stage IV pancreatic cancer and thrombocytopenia, she will follow up with Dr. Orlie Dakin. Overall prognosis is poor with regards to that.  5.  For her tobacco abuse, smoking cessation counseling was done during the hospital course.  6.  For her chronic back pain, she is on oxycodone.  7.  She does give history of a biliary tree stent in the common bile duct by Dr. Niel Hummer on ERCP on 09/25/2012. Dr. Servando Snare will follow up as outpatient and consider changing the stent or taking out that stent as outpatient, and then he will wait for healing of probably ulcerous process in the stomach prior to doing this.   TIME SPENT ON DISCHARGE:  35 minutes.   ____________________________ Herschell Dimesichard J. Renae GlossWieting, MD rjw:jm D: 01/14/2013 17:21:17 ET T: 01/14/2013 19:06:14 ET JOB#: [id number]  cc: Herschell Dimesichard J. Renae GlossWieting, MD, <Dictator> Cristal DeerErica R. Earlene PlaterWallace, DO Salley ScarletICHARD J Shaindel Sweeten MD ELECTRONICALLY SIGNED 01/15/2013 20:16

## 2015-01-15 NOTE — Consult Note (Signed)
Brief Consult Note: Diagnosis: GI bleed with anemia.   Patient was seen by consultant.   Consult note dictated.   Comments: The patient ha dark red and black stools. She was on NSAID's with last chemo. No N/V. Has history of pancreatic ca. S/P transfusion of 2 units and hb 10 last night and 9.9 today. No further bowel movements today. Start clears and continue PPI.  Electronic Signatures: Midge MiniumWohl, Gracelynne Benedict (MD)  (Signed 20-Apr-14 09:51)  Authored: Brief Consult Note   Last Updated: 20-Apr-14 09:51 by Midge MiniumWohl, Ardella Chhim (MD)

## 2015-01-15 NOTE — H&P (Signed)
PATIENT NAME:  Hannah Pennington, Hannah Pennington MR#:  [id number] DATE OF BIRTH:  1941-07-16  DATE OF ADMISSION:  01/11/2013  PRIMARY CARE PHYSICIAN: Tollie Pizza. Orlie Dakin, MD  HISTORY OF PRESENT ILLNESS: The patient is a 74 year old African American female with past medical history significant for history of pancreatic carcinoma, stage IV with metastasis to liver and progressive disease, who underwent chemotherapy approximately a week ago, presents to the hospital with gastrointestinal bleed. Apparently, she was in the bathroom and the patient's daughter heard a shuffling sound in the bathroom. When she came in, she noted that her mother had bleeding. It was frank blood in the commode, also with dark stool, and it was actually filled with blood. The patient's daughter also stated that her mother syncopized. The patient herself does not remember a syncopal episode, however, admitted of bleeding. On arrival to Emergency Room, the patient was noted to be hypotensive with systolic blood pressure in 90s; however, her systolic blood pressure dropped down even lower to 78 later in the day during her treatment period in the Emergency Room. Hospitalist services were contacted for admission. She was noted to be anemic and she is being prepared to receive packed red blood cell transfusion.   PAST MEDICAL HISTORY: Significant for a history of stage IV pancreatic carcinoma with metastasis to liver and progressive disease. She is receiving gemcitabine as well as Abraxane therapy for pancreatic cancer; however, apparently cycle was delayed due to thrombocytopenia, but the patient stated that she received 1 treatment last week. According to Dr. Milinda Cave note, the patient will be receiving treatment every other day since she is intolerant to this therapy. Status post admission to [facility] in January 2014 due to obstructive jaundice. She underwent ERCP on 09/25/2012 by Dr. Niel Hummer. 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 Main bile duct was dilated and stent was placed in common bile duct. Also, past medical history is significant for back pain, nausea, abdominal pain, pyuria with negative urine cultures, also tobacco abuse, malignant hypertension (which the patient had with rehydration), also new diagnosis of diabetes mellitus with hemoglobin A1c of 6.7. Also chronic obstructive pulmonary disease.   MEDICATIONS: According to medical records, the patient is on: 1.  Aleve 220 mg p.o. every 8 hours as needed.  2.  Enalapril 10 mg once a day.  3.  Oxycodone 10 mg orally every 6 hours as needed.  4.  ProAir HFA 2 puffs 4 times daily as needed.  5.  Tessalon Perles 200 mg twice daily as needed.  6.  TussiCaps 8 mg/10 mg capsule extended-release every 12 hours as needed.  7.  Tussionex Pennkinetic 10 mg/8 mg in 5 mL oral solution, 5 mL twice a day, also as needed.  8.  Zantac 75 mg p.o. twice daily.  9.  Zofran 8 mg every 8 hours as needed.   SOCIAL HABITS: The patient is a chronic smoker, 1 pack per day since age 48. No alcohol, occasional beer. No drug abuse.   FAMILY HISTORY: The patient does not know much about her father. The patient's mother died of complications of COPD as well as colon cancer.  REVIEW OF SYSTEMS: Difficult to obtain, as the patient is a little somnolent. She admits of pain in the right upper quadrant which seems to be chronic, as well as some upper abdomen which seems to be chronic. She admits of having lightheadedness as well as dizziness today while she was walking to the bathroom. She admits of syncopal episode  she was told about. She tells me that she is not losing weight; however, her weight is low at 98 pounds recently. She admits of having intermittent hemoptysis and had a small amount of hemoptysis today with cough. CONSTITUTIONAL: Denies any high fevers or chills. Admits of fatigue and weakness. Denies any significant weight loss recently.  EYES: No vision problems  such as blurry vision, double vision, glaucoma or cataracts.  EARS, NOSE, THROAT: Denies any tinnitus, allergies, epistaxis, sinus pain, dentures, difficulty swallowing. RESPIRATORY: Admits of cough. Denies any wheezing. Admits of hemoptysis. Denies any significant shortness of breath.  CARDIOVASCULAR: Denies any chest pains, orthopnea, edema, arrhythmias. The patient admits of syncope.  GASTROINTESTINAL: Denies nausea, vomiting recently or diarrhea. Admits of having constipation over the past 1 week, however, had at least 4 bowel movements last Saturday, which was a week ago, and now today she has again loose stools, dark-looking stools as well as frank red blood in her stools/rectal bleeding, but no hematemesis.  GENITOURINARY: Denies dysuria, hematuria, frequency or incontinence.  ENDOCRINE: Denies any polydipsia, nocturia, thyroid problems, heat or cold intolerance or thirst.  HEMATOLOGIC: Denies anemia, easy bruising, bleeding, swollen glands.  SKIN: Denies any acne, rashes, lesions.  NEUROLOGIC: Denies numbness, weakness.  PSYCHIATRIC: Denies anxiety.   PHYSICAL EXAMINATION:  VITAL SIGNS: On arrival to the hospital, the patient's temperature was 97.3, pulse 110, respiration rate 20, blood pressure 87/63, saturation 98% on oxygen therapy.  GENERAL: This is a well-developed, thin elderly female lying on the stretcher. She is pale but seemed to be comfortable.  HEENT: Her pupils are equal, reactive to light. Extraocular movements intact. No icterus or conjunctivitis. Has normal hearing. No pharyngeal erythema. Mucosa is dry.  NECK: No masses. Supple, nontender. Thyroid not enlarged. No adenopathy. No JVD or carotid bruits bilaterally. Full range of motion.  LUNGS: Clear to auscultation anteriorly. No wheezes, diminished breath sounds or labored inspirations or increased effort to breathe were noted. Not in overt respiratory distress.  CARDIOVASCULAR: S1, S2 appreciated. No murmurs, gallops, rubs  noted. The patient's rhythm was regular, however, tachycardic. PMI not lateralized. Chest is nontender to palpation. Of note, the patient does have a Port-A-Cath placed in the right upper chest. EXTREMITIES: Pedal pulses 1+. No lower extremity edema, calf tenderness or cyanosis was noted.  ABDOMEN: Soft, minimally tender in right upper quadrant as well as epigastric area. The patient does have voluntary guarding on the right side/right upper quadrant. Otherwise, abdomen is nontender. The patient does not have any masses. No hepatosplenomegaly or masses otherwise were noted.  RECTAL: Deferred.  MUSCULOSKELETAL: Muscle strength: The patient was able to move all extremities. No cyanosis or degenerative joint disease. Not able to assess for kyphosis. Gait not tested.  SKIN: Did not reveal any rashes, lesions, erythema, nodularity or induration. It was warm and dry otherwise everywhere, except distal areas of her upper extremities as well as lower extremities, her feet as well as her hands.  LYMPHATIC: No adenopathy was  noted in the cervical region.  NEUROLOGICAL: Cranial nerves grossly intact. Sensory is intact. No dysarthria or aphasia. The patient was alert, oriented to time, person and place, cooperative. Memory is good.  PSYCHIATRIC: No significant confusion, agitation or depression.   LABORATORY AND DIAGNOSTIC DATA: The patient's EKG is not yet done. BMP showed glucose 197, sodium 133; otherwise, BMP was unremarkable. Albumin level was 2.2. Liver enzymes are still pending. White cell count is elevated to 18.5, hemoglobin 7.8 and platelet count 102, as compared to hemoglobin  level of 9.1 on 01/07/2013. The patient's coagulation panel: Pro time was 16.1 and INR was 1.3.   RADIOLOGIC STUDIES: GI bleeding scan is ordered, however; it is not yet done.   ASSESSMENT AND PLAN:  1.  Acute posthemorrhagic anemia: Admit patient to medical floor. Type and cross, crossmatch 2 units stat, transfuse 2 units now  and hold 2 units for possible later transfusion. Will check the patient's hemoglobin level every 8 hours and will make decisions about transfusions as necessary.  2.  Gastrointestinal bleed: Will send patient for gastrointestinal bleeding scan and will get vascular surgeon involved if needed.  3.  Posthemorrhagic shock: Will continue intravenous fluids as well as transfusion.  4.  Right upper quadrant pain: Likely chronic due to pancreatic cancer. Continue pain medications. 5.  History of chronic obstructive pulmonary disease: Continue outpatient management.  6.  History of hypertension: Will hold the patient's blood pressure medications since she is hypotensive here. 7.  History of nausea: Will continue Zofran or Zantac as needed.   TIME SPENT: 50 minutes.   ____________________________ Katharina Caperima Kandee Escalante, MD rv:jm D: 01/11/2013 16:31:13 ET T: 01/11/2013 17:22:02 ET JOB#: [id number]  cc: Katharina Caperima Seanmichael Salmons, MD, <Dictator> Tollie Pizzaimothy J. Orlie DakinFinnegan, MD Katharina CaperIMA Jelissa Espiritu MD ELECTRONICALLY SIGNED 01/26/2013 18:05

## 2015-01-15 NOTE — Consult Note (Signed)
Reason for Visit: This 74 year old Female patient presents to the clinic for initial evaluation of  pancreatic cancer .   Referred by Dr. Orlie Pennington.  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 She also developed jaundice was seen and admitted to the hospital for workup. Blood work at that time showed direct bilirubin of 7.9 and a CEA 19-9 of 4089. CT scan of the abdomen on September 24, 2012 showed a mass in the is in the process of the pancreas withcompression the common bile duct. ERCP was performed showing extrahepatic delivery duct dilatation. There was severe stricture the midportion the common duct concerning for malignancy. A stent was placed. Tumor abutted the inferior vena cava. PET CT performed showed PET positive pancreatic head mass consistent with pancreatic cancer. She was seen at [facility] underwent endoscopic ultrasound with tissue positive for adenocarcinoma consistent with pancreatic origin. Endoscopic clinical staging was a T2 N0 lesion. She'Pennington been seen by Dr. Orlie Pennington and case has been reviewed by Dr. Madaline Pennington. Dr. Madaline Pennington as I consider her surgical candidate at the present time. He has recommended concurrent chemotherapy and radiation. She seen today for radiation oncology consultation.  Past Hx:    Hypertension:   Past, Family and Social History:   Past Medical History positive    Cardiovascular hypertension; malignant hypertension    Endocrine diabetes mellitus    Family History  noncontributory    Social History positive    Social History Comments greater than 40-pack-year smoking history, no EtOH abuse history    Additional Past Medical and Surgical History accompanied by his son today   Allergies:   Statins: Alt Ment Status, GI Distress, Anxiety  Aspirin: GI Distress  Home Meds:  Home Medications: Medication Instructions Status  Compazine tablet 10 mg 1 tab(Pennington) orally every 6 hours x 30 days as needed   Active  acetaminophen-hydrocodone 325 mg-5 mg oral tablet tab(Pennington) orally every 4 hours, As Needed- for Pain quantity: 60 tabs Active  lisinopril 10 mg oral tablet 1 tab(Pennington) orally once a day Active   Review of Systems:   General negative    Performance Status (ECOG) 0    Skin negative    Breast negative    Ophthalmologic negative    ENMT negative    Respiratory and Thorax negative    Cardiovascular see HPI    Gastrointestinal see HPI    Genitourinary negative    Musculoskeletal negative    Neurological negative    Psychiatric negative    Hematology/Lymphatics negative    Endocrine negative    Allergic/Immunologic negative    Review of Systems   according to the nurse'Pennington notes aside from not nausea and vomiting recently presented with jaundice, and history of hypertensionPatient denies any weight loss, fatigue, weakness, fever, chills or night sweats. Patient denies any loss of vision, blurred vision. Patient denies any ringing  of the ears or hearing loss. No irregular heartbeat. Patient denies heart murmur or history of fainting. Patient denies any chest pain or pain radiating to her upper  extremities. Patient denies any shortness of breath, difficulty breathing at night, cough or hemoptysis. Patient denies any swelling in the lower legs. Patient denies any nausea vomiting, vomiting of blood, or coffee ground material in the vomitus. Patient denies any stomach pain. Patient states has had normal bowel movements no significant constipation  or diarrhea. Patient denies any dysuria, hematuria or significant nocturia. Patient denies any problems walking, swelling in the joints or loss of balance. Patient denies any skin changes, loss of hair or loss of weight. Patient denies any excessive worrying or anxiety or significant depression. Patient denies any problems with insomnia. Patient denies excessive thirst, polyuria, polydipsia. Patient denies any swollen glands, patient denies easy bruising or easy bleeding. Patient denies any recent infections, allergies or URI. Patient "Pennington visual fields have not changed significantly in recent time.  Nursing Notes:  Nursing Vital Signs and Chemo Nursing Nursing Notes: *CC Vital Signs Flowsheet:   16-Jan-14 11:07   Temp Temperature 98   Pulse Pulse 94   Respirations Respirations 20   SBP SBP 171   Pain Scale (0-10)  6   Current Weight (kg) (kg) 45.1   Height (cm) centimeters 157   BSA (m2) 1.4   Physical Exam:  General/Skin/HEENT:   General normal    Eyes normal    ENMT normal    Head and Neck normal    Additional PE well-developed thin female in NAD. There still some slight scleral icterus. Lungs are clear to A&P cardiac examination shows regular rate and rhythm. Abdomen is tense some pain is elicited on palpation in the region of the celiac axis. No pain is noted on deep palpation of her spine.   Breasts/Resp/CV/GI/GU:   Respiratory and Thorax normal    Cardiovascular normal    Gastrointestinal normal    Genitourinary normal   MS/Neuro/Psych/Lymph:   Musculoskeletal normal    Neurological normal    Lymphatics normal   Assessment and Plan:  Impression:   a clinical stage IB adenocarcinoma head of the pancreas in 74 year old female  Plan:   case was personally discussed with Dr. Orlie DakinFinnegan. We will go ahead and recommend concurrent chemotherapy and radiation therapy. She may become an operative candidate after completion of radiation and chemotherapy and will be  reevaluated by surgical oncology at that time. I would plan on using IMRT treatment planning and delivery to spare critical structures such as runny kidneys small bowel and liver and spinal cord. Would treat up to 5400 cGy over 5 weeks. Risks and benefits of treatment were reviewed with the patient and her son and both seem to [place] treatment plan well. Side effects such as nausea, possiblediarrhea, fatigue, and alteration blood counts were all explained in detail. I have set her up for CT simulation later this week.  I would like to take this opportunity to thank you for allowing me to continue to participate in this patient'Pennington care.  CC Referral:   cc: Dr. Gaspar Biddingavid Hannah Pennington   Electronic Signatures: Hannah Pennington, Hannah Pennington (MD)  (Signed 16-Jan-14 12:15)  Authored: HPI, Diagnosis, Past Hx, PFSH, Allergies, Home Meds, ROS, Nursing Notes, Physical Exam, Encounter Assessment and Plan, CC Referring Physician   Last Updated: 16-Jan-14 12:15 by Hannah Alerthrystal, Hannah Pennington (MD)

## 2015-01-15 NOTE — Discharge Summary (Signed)
PATIENT NAME:  Hannah Pennington, Hannah Pennington MR#:  [id number] DATE OF BIRTH:  1941/06/23  DATE OF ADMISSION:  09/24/2012 DATE OF DISCHARGE:  09/26/2012  ADMITTING DIAGNOSIS:  Jaundice.   DISCHARGE DIAGNOSES:   1.  Obstructive jaundice status post ERCP on 09/25/2012 by Dr. Niel Hummer, severe biliary stricture was found in the distal half of bile duct.  Stricture was malignant appearing.  The upper half of main bile duct was dilated.  Endoscopic retrograde insertion of one 10 French x 7 cm temporary stent with single external flap, and the single internal flap was placed 6 cm into the common bile duct.  The stent was in good position.    2.  Suspected pancreatic cancer.  3.  Back pain. 4.  Nausea.  5.  Abdominal pain.  6.  Pyuria.  Negative urinary cultures.  7.  Tobacco abuse.  8.  Malignant hypertension with rehydration.  9.  The patient also has a new diagnosis of diabetes mellitus.  Hemoglobin A1c 6.7.    DISCHARGE CONDITION:  Stable.   DISCHARGE MEDICATIONS:  The patient is to continue ProAir HFA 1 puff 4 times daily as needed, Enalapril 10 mg p.o. twice a day, Vicodin 5/325 mg 1 p.o. every 4 hours as needed.    HOME OXYGEN:  None.   DIET:  2 grams salt, low fat, low cholesterol, carbohydrate-controlled diet, regular consistency.   ACTIVITY LIMITATIONS:  As tolerated.   FOLLOWUP APPOINTMENTS:  With Dr. Orlie Dakin in 2 days after discharge, Dr. Niel Hummer in 1 week after discharge, Dr. Earlene Plater in 2 days after discharge.   CONSULTATIONS:  Dr. Orlie Dakin, Dr. Niel Hummer, care management.   RADIOLOGIC STUDIES:  CT scan of abdomen with and without contrast 09/24/2012 showing findings consistent with a mass in the uncinate process of the pancreas with associated mass effect upon the common bile duct and intrahepatic and extrahepatic biliary ductal dilatation.  Further evaluation of these findings with endoscopic ultrasound is recommended, according to radiologist.  ERCP 09/25/2012 revealed superior  extrahepatic biliary duct is dilated.  There is intrahepatic biliary ductal dilatation.  There is a focal severe stricture in the mid portion of the common bile duct.  This is concerning for malignancy.  A stent is present overlying the common bile duct on the last image.  The remainder of the interpretation is deferred to the performing clinician.  Ultrasound of abdomen, limited survey study of December 2013 revealed ileus of abnormal echotexture within the pancreatic head, porta hepatis region concerning for underlying malignant disease.  There is common bile duct dilatation with intrahepatic ductal dilatation.  The common bile duct measures up to 1.32 cm.  The pancreatic duct is distended to 3.3 mm.  Findings are most concerning for pancreatic malignancy with regional metastatic involvement.  There is some compression of the inferior vena cava.  There is no definite thrombus.  Atherosclerotic irregularities noted in the aorta.  The gallbladder is filled with sludge.  A PET CT scan of whole body done on 09/26/2012 revealed PET positive pancreatic head mass, most consistent with pancreatic cancer.  No other PET positive abnormalities identified according to radiologist.   HISTORY OF PRESENT ILLNESS:  The patient is a 74 year old African American female with past medical history significant for history of tobacco abuse, history of hypertension who presented to the hospital with complaints of jaundice as well as back pain.  Please refer to Dr. Riley Nearing admission on 09/23/2012.  The patient apparently was noted to have anorexia as well as a few pound  weight loss, also back pains, icterus and dark urine over the past 3 to 4 weeks.  She was seen by primary care physician, Dr. Earlene PlaterWallace, who ordered blood work which revealed evidence of obstructive jaundice with bilirubin reaching 11 and direct bilirubin of 7.9 as well as elevated liver transaminases.  She had an ultrasound which revealed obstructive pattern with  possible tumor in the head of pancreas and she was admitted to the hospital for further evaluation.  In the Emergency Room, she was noted to have blood pressure of 160/84, saturation was 96% on room air, temperature was 97.9, pulse was 82, respiration rate was 18.  Physical exam was remarkable for jaundice as well as some discomfort in the epigastrium area.  The patient's lab data in the Emergency Room showed a glucose of 200, sodium 135, otherwise BMP was unremarkable.  The patient's liver enzymes revealed a total bilirubin of 13.7, alkaline phosphatase 328, AST 477, ALT 665.  Hemoglobin A1c was checked, was found to be 6.7.  CBC, white blood cell count 7.9, hemoglobin 14.8, platelet count 204.  Coagulation panel was unremarkable.  Urinalysis revealed red cloudy urine, 150 mg/dL of glucose, 2+ bilirubin, trace ketones, specific gravity 1.015, pH was 6.0, negative for blood, 30 mg/dL protein, negative for nitrites, trace leukocyte esterase, 3 red blood cells, 3 to 6 white blood cells, trace bacteria, 3 epithelial cells.  Mucus was present as well as hyaline casts.  Urine cultures, however, were negative.  The patient was admitted to the hospital.  She had CA19-9 taken as well as CT scan of her abdomen and pelvis done with and without contrast, results was which I dictated above.  Those CT results were concerning for a mass in the uncinate process of the pancreas with associated mass effect upon common bile duct as well as intrahepatic and extrahepatic biliary ductal dilatation.  For this reason, gastroenterology consultation as well as consultation with oncologist were obtained.  Gastroenterologist proceeded to ERCP on 09/25/2012.  ERCP was accomplished without significant difficulties and stent was placed in the common bile duct.  It was in good position.  A severe biliary stricture was found in distal half of the duct and stricture was malignant-appearing.  Brushings were obtained.  After the stent was placed, the  patient's total bilirubin decreased to half of what it was before.  As mentioned above, initially on arrival to the hospital, patient's total bilirubin was 13.7.  It was rechecked after stent was placed on 09/26/2012, was found to be 6.2 with direct bilirubin of 4.9, previously 10.8 to 11.  The patient's alkaline phosphatase was also somewhat low at 517 and AST as well as ALT were also low at 268 and 504 respectively.  The patient's CA19-9 came back very high, elevated at 4089, very concerning for pancreatic carcinoma.  PET CT scan was performed which revealed changes in pancreatic head.  It revealed the changes in the pancreatic head most consistent with pancreatic cancer.  Dr. Orlie DakinFinnegan saw patient in consultation and recommended endoscopic ultrasound.  Dr. Orlie DakinFinnegan will be arranging [facility], [facility].  Cytology from ERCP was pending and the patient is to follow up with Dr. Orlie DakinFinnegan in the next few days after discharge to discuss the results as well as treatment planning.  The patient expressed understanding and was in agreement with this plan.  Because of pain, patient was started on Vicodin orally.    Next, the patient's blood pressure was noted to be very elevated.  The patient's Vasotec was advanced to twice daily, to 10 mg twice daily dose.  It is recommended to advance patient's medications if needed to control her blood pressure even closer.    Next, for hyperglycemia, the patient's hemoglobin A1c was found to be high at 6.7.  Patient is diagnosed with new onset diabetes mellitus.  She was recommended to continue a diabetic diet.   For nausea, the patient did well with no nausea medications, in fact, in the hospital and was able to eat a regular diet, however she may need nausea medications in the future.   Next, for pyuria, the patient's urine cultures were taken because urinalysis was done and was abnormal; however, patient's urine cultures were negative for any  growth.  The patient is being discharged in stable condition with above-mentioned medications and follow-up.   VITAL SIGNS ON THE DAY OF DISCHARGE:  Temperature was 98, pulse was 78, respiration rate was 20, blood pressure around 170s/90s.  Oxygen saturation was 95% on room air at rest.   TIME SPENT:  Forty minutes.       ____________________________ Katharina Caper, MD rv:ea D: 09/26/2012 19:11:00 ET T: 09/26/2012 23:05:53 ET JOB#: [id number]  cc: Katharina Caper, MD, <Dictator> Oluwatobi Ruppe MD ELECTRONICALLY SIGNED 10/20/2012 21:02

## 2015-01-15 NOTE — Consult Note (Signed)
PATIENT NAME:  Hannah Pennington, Hannah Pennington MR#:  [id number] DATE OF BIRTH:  07/01/1941  DATE OF CONSULTATION:  01/12/2013  REFERRING PHYSICIAN:  Dr. Vaickute.  CONSULTING PHYSICIAN:  Sandeep R. Pandit, MD  REASON FOR CONSULTATION: Pancreatic cancer, GI bleed.   HISTORY OF PRESENT ILLNESS: The patient is a 74-year-old female who has been admitted to the hospital on April 19th with complaints of GI bleeding. She had bright red blood in stools and also gives a history of some dark stools lately. 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 Recently has been on chemotherapy. She was seen by Dr. Finnegan on April 15th, and chemotherapy was held due to thrombocytopenia of 31,000. She also was hypotensive upon admission and is currently in the CCU. States that she is still weak and feels tired. Hemoglobin today is better at 9.9 grams. Platelet count better at 92. States that she has not had any bowel movement since admission. Denies other bleeding symptoms.   PAST MEDICAL HISTORY AND SURGICAL HISTORY:  1. Stage IV pancreatic cancer.  2. Status post ERCP in January 2014 and stent placement.  3. Hypertension.  4. Smoking.  5. COPD.   FAMILY HISTORY: Noncontributory.   SOCIAL HISTORY: Currently denies smoking or alcohol usage.   HOME MEDICATIONS: Enalapril 10 mg daily, oxycodone 10 mg q.6 hours p.r.n., Aleve 220 mg q.8 hours p.r.n., ProAir 2 puffs 4 times daily as needed, Tessalon Perles 200 mg b.i.d., TussiCaps extended release q.12 hours b.i.d., Zantac 75 mg b.i.d., Zofran 8 mg q.8 hours p.r.n.   ALLERGIES: INCLUDE ASPIRIN, STATINS.   REVIEW OF SYSTEMS:  CONSTITUTIONAL: As in HPI. No fevers or chills.  HEENT: Denies any headaches or dizziness at rest. No epistaxis, ear or jaw pain.  CARDIAC: Denies any angina, palpitation, orthopnea or PND.   LUNGS: Has chronic cough and dyspnea on exertion. Denies hemoptysis or chest pain.  GASTROINTESTINAL: As in HPI. Currently,  no nausea or vomiting.  GENITOURINARY: No dysuria or hematuria.  SKIN: No new rashes or pruritus.  HEMATOLOGIC: Denies other obvious bleeding symptoms.  EXTREMITIES: No new swelling or pain.  NEUROLOGIC: No new focal weakness, seizures or loss of consciousness.  ENDOCRINE: No polyuria or polydipsia. Appetite is borderline poor.    PHYSICAL EXAM:  GENERAL: The patient is frail and weak looking, resting in bed, otherwise alert and oriented x4 and converses appropriately. Mild pallor.  VITAL SIGNS: 98.1, 98, 26, 155/78, 96% on room air.  HEENT: Normocephalic, atraumatic. Extraocular movements intact. Sclerae mildly icteric. Mild pallor.  CARDIOVASCULAR: S1, S2, regular rate and rhythm.  LUNGS: Bilateral diminished breath sounds overall. No rhonchi.  ABDOMEN: Soft, mildly distended, tenderness present all over but mostly in the right upper quadrant area. No guarding or rigidity.  EXTREMITIES: Mild edema. No cyanosis.  SKIN: No generalized rashes or major bruising.  NEUROLOGIC: Limited exam. Cranial nerves intact. Moves all extremities spontaneously.   LAB RESULTS: Creatinine 0.5, BUN 10, potassium 4.0, calcium 7.8. Liver functions show bilirubin of 1.0, alk phos 469 and albumin low. Normal liver transaminases. Hemoglobin 9.9, WBC 9600, platelets 92,000, ANC 7500.   IMPRESSION AND RECOMMENDATIONS: A 74-year-old female patient with a history of stage IV pancreatic cancer on palliative chemotherapy; treatment was held on April 15th by Dr. Finnegan given low platelet count of 31,000. The patient currently admitted to hospital with gastrointestinal bleed. Hemoglobin is better today at 9.9 grams after transfusion support. Leukocytosis is improved, and thrombocytopenia is better at 92. She has   been evaluated by gastroenterology, Dr. Wohl, who feels that it is likely upper gastrointestinal bleed, possibly nonsteroidal anti-inflammatory drug induced versus other etiology and if she has further bleeding, then  possibly endoscopy is going to be considered. Agree with ongoing supportive treatment. Thrombocytopenia is likely chemotherapy related and seems to be improving at this time. No acute pain issues at this time. Continue supportive treatment. Will request Dr. Finnegan to start following her April 21st and onwards. The patient explained above, agreeable to this plan.   Thank you for the referral. Please feel free to contact me for additional questions.    ____________________________ Sandeep R. Pandit, MD srp:gb D: 01/13/2013 00:03:03 ET Pennington: 01/13/2013 00:52:55 ET JOB#: [id number]  cc: Sandeep R. Pandit, MD, <Dictator> SANDEEP R PANDIT MD ELECTRONICALLY SIGNED 01/13/2013 9:46 

## 2015-01-15 NOTE — Op Note (Signed)
PATIENT NAME:  Hannah Pennington, Hannah Pennington MR#:  [id number] DATE OF BIRTH:  07-Mar-1941  DATE OF PROCEDURE:  03/21/2013  PREOPERATIVE DIAGNOSES: 1.  Pulmonary embolism. 2.  Deep venous thrombosis.  2.  Pancreatic cancer.   POSTOPERATIVE DIAGNOSES: 1.  Pulmonary embolism. 2.  Deep venous thrombosis.  2.  Pancreatic cancer.   PROCEDURE:  Inferior venacavogram.   PROCEDURE PERFORMED BY:  Levora DredgeGregory Schnier, MD   SEDATION: Versed 2 mg plus fentanyl 50 mcg administered IV. Continuous ECG, pulse oximetry, and cardiopulmonary monitoring is performed throughout the entire procedure by the interventional radiology nurse. Total sedation time is about 40 minutes.   ACCESS:  A 9 French sheath, right common femoral vein.   CONTRAST USED:  Isovue 60 mL.   FLUOROSCOPY TIME:  Approximately 3 minutes.   INDICATIONS:  Hannah Pennington is a 74 year old woman with metastatic pancreatic cancer, who was found to have a PE, in spite of being on Coumadin. She is therefore undergoing evaluation for filter placement. Risks and benefits were reviewed. All questions answered. The patient agrees to proceed.   DESCRIPTION OF PROCEDURE:  The patient is taken to special procedures and placed in the supine position. After adequate sedation is achieved, both groins are prepped and draped in sterile fashion. Ultrasound is placed in a sterile sleeve. Ultrasound is utilized secondary to lack of appropriate landmarks and to avoid vascular injury. Under direct ultrasound visualization, access is obtained to the common femoral vein. Common femoral vein is echolucent and compressible, indicating patency. Image is recorded for the permanent record. Under direct ultrasound visualization, the puncture is made. Microwire and micro sheath are placed without difficulty. J-wire is then advanced without difficulty. However, difficulty is encountered advancing the delivery sheath, which is 929 [place]French. Ultimately, the wire is exchanged for an Amplatz. This  still does not allow for passage. Subsequently, 4 [place], 6 [place], 8 [place], and then a 9 [place] dilator is advanced serially over the wire, and then the sheath advances into the inferior vena cava under fluoroscopic guidance without problem. Hand injection of contrast is then performed, which shows a massively dilated vena cava. A second image demonstrates that the left renal vein is over 20 mm in diameter, and there is reflux of contrast into the kidney. 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 There also appears to be a stenosis in the suprarenal portion of the cava as well. The inferior vena cava itself measures approximately 3.4 to 4 mm in diameter. After review of these images, she is not a candidate for IVC filter placement. The sheath is removed. Pressure is held, and there are no immediate complications.   INTERPRETATION:  As noted above, there is clearly extrinsic compression from a pancreatic mass, which has now created massive dilatation of the inferior vena cava as well as of the left renal vein. 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 The patient is not a candidate for IVC filter, and will be maintained on Coumadin.    ____________________________ Renford DillsGregory G. Schnier, MD ggs:mr D: 03/22/2013 10:46:33 ET Pennington: 03/22/2013 20:42:00 ET JOB#: [id number]  cc: Renford DillsGregory G. Schnier, MD, <Dictator> Tollie Pizzaimothy J. Orlie DakinFinnegan, MD  Renford DillsGREGORY G SCHNIER MD ELECTRONICALLY SIGNED 04/01/2013 14:29

## 2015-01-15 NOTE — Consult Note (Signed)
Chief Complaint:   Subjective/Chief Complaint No complaints.   VITAL SIGNS/ANCILLARY NOTES: **Vital Signs.:   02-Jan-14 10:16   Vital Signs Type Routine   Temperature Temperature (F) 98.6   Celsius 37   Temperature Source oral   Pulse Pulse 79   Respirations Respirations 20   Systolic BP Systolic BP 178   Diastolic BP (mmHg) Diastolic BP (mmHg) 884   Mean BP 648   Pulse Ox % Pulse Ox % 95   Pulse Ox Activity Level  At rest   Oxygen Delivery Room Air/ 21 %   Brief Assessment:   Additional Physical Exam Abdomen is soft and benign.   Lab Results: Hepatic:  02-Jan-14 06:11    Bilirubin, Total  6.2   Bilirubin, Direct  4.90 (Result(s) reported on 26 Sep 2012 at 08:05AM.)   Alkaline Phosphatase  517   SGPT (ALT)  504   SGOT (AST)  268   Total Protein, Serum 6.6   Albumin, Serum  2.9  Routine Chem:  02-Jan-14 06:11    Lipase 148 (Result(s) reported on 26 Sep 2012 at 08:05AM.)   Assessment/Plan:  Assessment/Plan:   Assessment Pancreatic mass most likely malignant s/p stent placement with significant improvement in LFT's. Lipase normal. Cytology pending.    Plan Soft diet. Anticipate DC soon. I will see her next week as OP (orders written). Will sign off. Please call on call GI if needed. Thanks.   Electronic Signatures: Lurline DelIftikhar, Lucrecia Mcphearson (MD)  (Signed 02-Jan-14 10:51)  Authored: Chief Complaint, VITAL SIGNS/ANCILLARY NOTES, Brief Assessment, Lab Results, Assessment/Plan   Last Updated: 02-Jan-14 10:51 by Lurline DelIftikhar, Mayana Irigoyen (MD)

## 2015-01-15 NOTE — Consult Note (Signed)
PATIENT NAME:  Hannah LeylandWILLIAMS, Jolette T MR#:  [id number] DATE OF BIRTH:  [birth year]  DATE OF ADMISSION:  01/11/2013  DATE OF CONSULTATION:  01/12/2013  CONSULTING SERVICE: Gastroenterology.  CONSULTING PHYSICIAN:  Midge Miniumarren Dene Nazir, MD  REASON FOR CONSULTATION:   GI bleed.  HISTORY OF PRESENT ILLNESS: This patient is a 74 year old woman who has a history of pancreatic cancer, stage IV, with metastasis to the liver, and underwent chemotherapy recently. The patient also reports that she was taking NSAIDs at the time of the chemotherapy. She is now admitted with a report of dark blood and black stools. She denies any abdominal pain during this time. The patient reports the commode was filled with blood. She reports that her daughter has a picture of it, but the patient does not have it with her. She decided that because of the bleeding she was going to come to the Emergency Room. She denies having the bleeding in the past.   On admission the patient was noted to have a hemoglobin of 7.8. She was transfused up to 10, and this morning it was 9.7. The patient's white cell count was also elevated on admission at 18.5, but is now down to 9.6. She has elevated liver enzymes on admission with a bilirubin of 1.1, with an alkaline phosphatase of 656 and AST of 53, with a normal ALT. Today the bilirubin has come down to normal at 1.0. The alkaline phosphatase has come down to 469, with the AST of 41 and the ALT remaining normal.  PAST MEDICAL HISTORY: Stage IV pancreatic cancer with metastases. The patient had an ERCP in January with a biliary stricture at that time.   MEDICATIONS: Aleve, enalapril, oxycodone, ProAir, Tessalon Perles, TussiCaps, Tussionex, Zantac, Zofran.  SOCIAL HISTORY: The patient is a chronic smoker. No alcohol or drug abuse.  FAMILY HISTORY: Noncontributory.  REVIEW OF SYSTEMS: A 10-point review of systems was negative except what is stated above.  PHYSICAL EXAMINATION:  GENERAL: The patient is  laying in bed in no apparent distress. She is in the ICU.  VITAL SIGNS: Temperature 98.6, pulse 104, respirations 30, blood pressure 145/73, pulse oximetry 93% on room air. HEENT: Normocephalic, atraumatic. Extraocular movements intact. No jaundice seen. NECK: Supple. LUNGS: Clear to auscultation bilaterally. HEART: Regular rate and rhythm, without murmurs, rubs, or gallops. ABDOMEN: Soft, nontender, without rebound, without guarding. EXTREMITIES:  With +1 pitting edema. MUSCULOSKELETAL: Good muscle strength. SKIN: Without any rashes or lesions. NEUROLOGIC: Grossly intact. PSYCHIATRIC: No significant confusion.  LABORATORY STUDIES: As stated above.  ASSESSMENT AND PLAN: This patient is a 74 year old woman with known pancreatic cancer with metastasis who came in with a gastrointestinal bleed after taking non-steroidal antiinflammatory medications. Due to the characteristics of the bleed, with dark stools and black stools it is likely the patient has an upper GI bleed. This, in addition to her being on antiinflammatories recently also goes with an upper GI bleed. The patient is no longer bleeding, which is evidenced by her not having any more bowel movements, and her hemoglobin has been stable. Will continue to follow her and follow the hemoglobin. If she has any further bleeding then will consider doing an upper endoscopy on this very nice patient.  Thank you very much for involving me in the care of this patient. I will follow along with you.    ____________________________ Midge Miniumarren Derrek Puff, MD dw:dm D: 01/12/2013 12:49:09 ET T: 01/12/2013 14:20:00 ET JOB#: [id number]  cc: Midge Miniumarren Yarielis Funaro, MD, <Dictator> Midge MiniumARREN Breauna Mazzeo MD ELECTRONICALLY SIGNED 01/15/2013 7:16

## 2015-01-15 NOTE — Consult Note (Signed)
Brief Consult Note: Diagnosis: PE and DVT associated with pancreatic cancer.   Patient was seen by consultant.   Recommend to proceed with surgery or procedure.   Comments: will plan IVCgram with IVC filter placement.  Electronic Signatures: Levora DredgeSchnier, Goldia Ligman (MD)  (Signed 27-Jun-14 19:47)  Authored: Brief Consult Note   Last Updated: 27-Jun-14 19:47 by Levora DredgeSchnier, Abia Monaco (MD)

## 2015-01-15 NOTE — Consult Note (Signed)
Chief Complaint:   Subjective/Chief Complaint ERCP done and biliary stent placed. Clear liquids later today. Repeat LFT's in am. CA 19-9 very high consistent with pancreatic cancer. Will follow.   Electronic Signatures: Lurline DelIftikhar, Lilliann Rossetti (MD)  (Signed 01-Jan-14 18:05)  Authored: Chief Complaint   Last Updated: 01-Jan-14 18:05 by Lurline DelIftikhar, Ericah Scotto (MD)
